# Patient Record
Sex: Female | Born: 1990 | Race: Black or African American | Hispanic: No | Marital: Single
Health system: Midwestern US, Community
[De-identification: ages and names within clinical notes are randomized; demographics above are authoritative.]

## PROBLEM LIST (undated history)

## (undated) DIAGNOSIS — O99012 Anemia complicating pregnancy, second trimester: Secondary | ICD-10-CM

## (undated) DIAGNOSIS — O98812 Other maternal infectious and parasitic diseases complicating pregnancy, second trimester: Secondary | ICD-10-CM

## (undated) DIAGNOSIS — A749 Chlamydial infection, unspecified: Secondary | ICD-10-CM

## (undated) DIAGNOSIS — O99013 Anemia complicating pregnancy, third trimester: Secondary | ICD-10-CM

## (undated) DIAGNOSIS — Z3402 Encounter for supervision of normal first pregnancy, second trimester: Secondary | ICD-10-CM

---

## 2016-02-13 ENCOUNTER — Emergency Department (HOSPITAL_COMMUNITY)
Admission: EM | Admit: 2016-02-13 | Discharge: 2016-02-13 | Disposition: A | Discharge status: Home / Self Care | Payer: Self-pay | Attending: Emergency Medicine | Admitting: Emergency Medicine

## 2016-02-13 ENCOUNTER — Encounter (HOSPITAL_COMMUNITY): Payer: Self-pay | Admitting: Emergency Medicine

## 2016-02-13 DIAGNOSIS — R531 Weakness: Secondary | ICD-10-CM | POA: Insufficient documentation

## 2016-02-13 DIAGNOSIS — R42 Dizziness and giddiness: Secondary | ICD-10-CM | POA: Insufficient documentation

## 2016-02-13 DIAGNOSIS — Z3202 Encounter for pregnancy test, result negative: Secondary | ICD-10-CM | POA: Insufficient documentation

## 2016-02-13 DIAGNOSIS — R200 Anesthesia of skin: Secondary | ICD-10-CM | POA: Insufficient documentation

## 2016-02-13 DIAGNOSIS — R109 Unspecified abdominal pain: Secondary | ICD-10-CM | POA: Insufficient documentation

## 2016-02-13 DIAGNOSIS — R197 Diarrhea, unspecified: Secondary | ICD-10-CM | POA: Insufficient documentation

## 2016-02-13 LAB — COMPREHENSIVE METABOLIC PANEL
ALT: 13 U/L — ABNORMAL LOW (ref 14–54)
ANION GAP: 12 (ref 5–15)
AST: 14 U/L — ABNORMAL LOW (ref 15–41)
Albumin: 4.1 g/dL (ref 3.5–5.0)
Alkaline Phosphatase: 46 U/L (ref 38–126)
BUN: 13 mg/dL (ref 6–20)
CALCIUM: 9.3 mg/dL (ref 8.9–10.3)
CHLORIDE: 104 mmol/L (ref 101–111)
CO2: 23 mmol/L (ref 22–32)
Creatinine, Ser: 0.83 mg/dL (ref 0.44–1.00)
GFR calc non Af Amer: >60 mL/min (ref >60)
GLUCOSE: 92 mg/dL (ref 65–99)
Potassium: 4 mmol/L (ref 3.5–5.1)
Sodium: 139 mmol/L (ref 135–145)
Total Bilirubin: 0.3 mg/dL (ref 0.3–1.2)
Total Protein: 7.4 g/dL (ref 6.5–8.1)

## 2016-02-13 LAB — CBC WITH DIFFERENTIAL/PLATELET
Basophils Absolute: 0 10*3/uL (ref 0.0–0.1)
Basophils Relative: 0 %
EOS ABS: 0.1 10*3/uL (ref 0.0–0.7)
EOS PCT: 1 %
HCT: 35.2 % — ABNORMAL LOW (ref 36.0–46.0)
Hemoglobin: 11.1 g/dL — ABNORMAL LOW (ref 12.0–15.0)
LYMPHS ABS: 1.5 10*3/uL (ref 0.7–4.0)
LYMPHS PCT: 20 %
MCH: 22.4 pg — AB (ref 26.0–34.0)
MCHC: 31.5 g/dL (ref 30.0–36.0)
MCV: 71 fL — AB (ref 78.0–100.0)
MONO ABS: 0.7 10*3/uL (ref 0.1–1.0)
Monocytes Relative: 9 %
Neutro Abs: 5.4 10*3/uL (ref 1.7–7.7)
Neutrophils Relative %: 70 %
PLATELETS: 310 10*3/uL (ref 150–400)
RBC: 4.96 MIL/uL (ref 3.87–5.11)
RDW: 13.9 % (ref 11.5–15.5)
WBC: 7.7 10*3/uL (ref 4.0–10.5)

## 2016-02-13 LAB — POC URINE PREG, ED: PREG TEST UR: NEGATIVE

## 2016-02-13 LAB — URINALYSIS, ROUTINE W REFLEX MICROSCOPIC
Bilirubin Urine: NEGATIVE
Glucose, UA: NEGATIVE mg/dL
Hgb urine dipstick: NEGATIVE
Ketones, ur: 15 mg/dL — AB
LEUKOCYTES UA: NEGATIVE
NITRITE: NEGATIVE
PH: 6 (ref 5.0–8.0)
Protein, ur: NEGATIVE mg/dL
SPECIFIC GRAVITY, URINE: 1.029 (ref 1.005–1.030)

## 2016-02-13 LAB — LIPASE, BLOOD: Lipase: 30 U/L (ref 11–51)

## 2016-02-13 MED ORDER — LOPERAMIDE HCL 2 MG PO CAPS
2.0000 mg | ORAL_CAPSULE | Freq: Once | ORAL | Status: AC
  Administered 2016-02-13: 2 mg via ORAL
  Filled 2016-02-13 (×2): qty 1

## 2016-02-13 MED ORDER — LOPERAMIDE HCL 2 MG PO CAPS: 2.0000 mg | ORAL_CAPSULE | ORAL | Status: DC | PRN

## 2016-02-13 NOTE — ED Notes (Addendum)
Pt. reports intermittent diarrhea for 2 weeks ( today x2) with nausea , upper abdominal pain  and mild SOB , denies emesis or fever .

## 2016-02-13 NOTE — Discharge Instructions (Signed)

## 2016-02-13 NOTE — ED Provider Notes (Signed)
CSN: [id number]     Arrival date & time 02/13/16  8657 History  By signing my name below, I, Barbara Conley, attest that this documentation has been prepared under the direction and in the presence of Shon Baton, MD. Electronically Signed: Phillis Conley, ED Scribe. 02/13/2016. 1:06 AM. . Chief Complaint  Patient presents with  . Diarrhea   The history is provided by the patient. No language interpreter was used.    HPI Comments: Barbara Conley is a 25 y.o. female who presents to the Emergency Department complaining of intermittent lightheadedness, room spinning and off balance dizziness, and diarrhea onset 2 weeks ago, worsening tonight. She reports associated cramping abdominal pain, bilateral lower extremity numbness and generalized weakness. She states that her significant other was sick recently with similar symptoms. She reports worsening pain with standing. She has not seen her PCP for this problem or taken anything at home for her symptoms. She denies fever, chills, chest pain, nausea, vomiting, or dysuria. She denies daily use of medication or allergies to medication.   History reviewed. No pertinent past medical history. History reviewed. No pertinent past surgical history. No family history on file. Social History  Substance Use Topics  . Smoking status: Never Smoker   . Smokeless tobacco: None  . Alcohol Use: No   OB History    No data available     Review of Systems  Constitutional: Negative for fever and chills.  Gastrointestinal: Positive for abdominal pain and diarrhea. Negative for nausea and vomiting.  Genitourinary: Negative for dysuria.  Neurological: Positive for dizziness and light-headedness.  All other systems reviewed and are negative.     Allergies  Review of patient's allergies indicates no known allergies.  Home Medications   Prior to Admission medications   Medication Sig Start Date End Date Taking? Authorizing Provider  loperamide  (IMODIUM) 2 MG capsule Take 1 capsule (2 mg total) by mouth as needed for diarrhea or loose stools. 02/13/16   Shon Baton, MD   BP 121/80 mmHg  Pulse 72  Temp(Src) 98.2 F (36.8 C) (Oral)  Resp 18  Ht  (1.753 m)  Wt 143 lb 1 oz (64.893 kg)  BMI 21.12 kg/m2  SpO2 100%  LMP 01/25/2016 (Approximate) Physical Exam  Constitutional: She is oriented to person, place, and time. She appears well-developed and well-nourished. No distress.  HENT:  Head: Normocephalic and atraumatic.  Cardiovascular: Normal rate, regular rhythm and normal heart sounds.   No murmur heard. Pulmonary/Chest: Effort normal and breath sounds normal. No respiratory distress. She has no wheezes.  Abdominal: Soft. Bowel sounds are normal. There is no tenderness. There is no rebound.  Neurological: She is alert and oriented to person, place, and time.  Cranial nerves II through XII intact, no dysmetria to finger-nose-finger, normal gait  Skin: Skin is warm and dry.  Psychiatric: She has a normal mood and affect.  Nursing note and vitals reviewed.   ED Course  Procedures (including critical care time) DIAGNOSTIC STUDIES: Oxygen Saturation is 100% on RA, normal by my interpretation.    COORDINATION OF CARE: 1:06 AM-Discussed treatment plan which includes labs with pt at bedside and pt agreed to plan.    Labs Review Labs Reviewed  CBC WITH DIFFERENTIAL/PLATELET - Abnormal; Notable for the following:    Hemoglobin 11.1 (*)    HCT 35.2 (*)    MCV 71.0 (*)    MCH 22.4 (*)    All other components within normal limits  COMPREHENSIVE  METABOLIC PANEL - Abnormal; Notable for the following:    AST 14 (*)    ALT 13 (*)    All other components within normal limits  URINALYSIS, ROUTINE W REFLEX MICROSCOPIC (NOT AT [facility]) - Abnormal; Notable for the following:    Ketones, ur 15 (*)    All other components within normal limits  LIPASE, BLOOD  POC URINE PREG, ED    Imaging Review No results found. I have  personally reviewed and evaluated these images and lab results as part of my medical decision-making.   EKG Interpretation None      MDM   Final diagnoses:  Diarrhea, unspecified type    Patient presents with two-week history of diarrhea. Also reports lightheadedness. Nontoxic and nonfocal. Vital signs are reassuring. No reproducible abdominal tenderness on exam. She is afebrile. She's not tried anything at home. Patient was given one dose of Imodium. She is not orthostatic. Basic labwork is reassuring.  Patient may have mild dehydration with 15 ketones in the urine. She is able to orally hydrate.  Doubt acute emergent process. Patient may need follow-up with primary physician or GI if diarrhea persists. She has no risk factors for infectious diarrhea or C. difficile. However, if diarrhea but she says she may need further studies including stool studies. Discussed with patient who is agreeable with plan.  After history, exam, and medical workup I feel the patient has been appropriately medically screened and is safe for discharge home. Pertinent diagnoses were discussed with the patient. Patient was given return precautions.  I personally performed the services described in this documentation, which was scribed in my presence. The recorded information has been reviewed and is accurate.    Shon Baton, MD 02/13/16 [phone]

## 2017-03-23 ENCOUNTER — Emergency Department (HOSPITAL_COMMUNITY): Payer: Self-pay

## 2017-03-23 ENCOUNTER — Emergency Department (HOSPITAL_COMMUNITY)
Admission: EM | Admit: 2017-03-23 | Discharge: 2017-03-23 | Disposition: A | Discharge status: Home / Self Care | Payer: Self-pay | Attending: Emergency Medicine | Admitting: Emergency Medicine

## 2017-03-23 ENCOUNTER — Encounter (HOSPITAL_COMMUNITY): Payer: Self-pay

## 2017-03-23 DIAGNOSIS — R0789 Other chest pain: Secondary | ICD-10-CM | POA: Insufficient documentation

## 2017-03-23 DIAGNOSIS — R10816 Epigastric abdominal tenderness: Secondary | ICD-10-CM | POA: Insufficient documentation

## 2017-03-23 LAB — BASIC METABOLIC PANEL
Anion gap: 8 (ref 5–15)
BUN: 11 mg/dL (ref 6–20)
CO2: 28 mmol/L (ref 22–32)
CREATININE: 0.74 mg/dL (ref 0.44–1.00)
Calcium: 9.8 mg/dL (ref 8.9–10.3)
Chloride: 103 mmol/L (ref 101–111)
Glucose, Bld: 86 mg/dL (ref 65–99)
Potassium: 3.8 mmol/L (ref 3.5–5.1)
SODIUM: 139 mmol/L (ref 135–145)

## 2017-03-23 LAB — CBC
HCT: 34.4 % — ABNORMAL LOW (ref 36.0–46.0)
Hemoglobin: 11.1 g/dL — ABNORMAL LOW (ref 12.0–15.0)
MCH: 23 pg — ABNORMAL LOW (ref 26.0–34.0)
MCHC: 32.3 g/dL (ref 30.0–36.0)
MCV: 71.2 fL — ABNORMAL LOW (ref 78.0–100.0)
PLATELETS: 327 10*3/uL (ref 150–400)
RBC: 4.83 MIL/uL (ref 3.87–5.11)
RDW: 14.3 % (ref 11.5–15.5)
WBC: 6.3 10*3/uL (ref 4.0–10.5)

## 2017-03-23 LAB — I-STAT TROPONIN, ED: Troponin i, poc: 0 ng/mL (ref 0.00–0.08)

## 2017-03-23 NOTE — ED Triage Notes (Addendum)
Per Pt, Pt is coming from home with complaints of intermittent throbbing chest pain x 1 month. Reports some SOB, Light headedness, Dizziness. Denies any nausea or vomiting. Has no Hx of the same. Reports cough and congestion with green sputum.

## 2017-03-23 NOTE — ED Provider Notes (Signed)
[facility]-EMERGENCY DEPT Provider Note   CSN: [id number] Arrival date & time: 03/23/17  1433     History   Chief Complaint Chief Complaint  Patient presents with  . Chest Pain    HPI Barbara Conley Barbara Conley is a 26 y.o. female.  HPI   This is a 26 year old female who presents today complaining of intermittent chest pain over the past month. States it comes up to 3 times a day and is present for seconds to minutes. She describes it as in the lower sternum and points to bilateral flank area as radiation. She has no associated symptoms. She denies any cough, fever, leg swelling, history of DVT or PE. She is not able to describe any causative factors or relieving factors.  History reviewed. No pertinent past medical history.  There are no active problems to display for this patient.   History reviewed. No pertinent surgical history.  OB History    No data available       Home Medications    Prior to Admission medications   Not on File    Family History No family history on file.  Social History Social History  Substance Use Topics  . Smoking status: Never Smoker  . Smokeless tobacco: Never Used  . Alcohol use No     Allergies   Patient has no known allergies.   Review of Systems Review of Systems  All other systems reviewed and are negative.    Physical Exam Updated Vital Signs BP 134/87   Pulse 66   Temp 98.1 F (36.7 C) (Oral)   Resp 14   Ht 5\' 9"  (1.753 m)   Wt 65.8 kg   LMP 02/22/2017   SpO2 100%   BMI 21.41 kg/m   Physical Exam  Constitutional: She is oriented to person, place, and time. She appears well-developed and well-nourished. No distress.  HENT:  Head: Normocephalic and atraumatic.  Right Ear: External ear normal.  Left Ear: External ear normal.  Nose: Nose normal.  Eyes: Conjunctivae and EOM are normal. Pupils are equal, round, and reactive to light.  Neck: Normal range of motion. Neck supple.  Cardiovascular: Normal rate, regular  rhythm, normal heart sounds and intact distal pulses.   Pulmonary/Chest: Effort normal and breath sounds normal. She exhibits tenderness.  Mild right peristernal chest tenderness  Abdominal: Soft. Bowel sounds are normal.  Mild epigastric tenderness to palpation  Musculoskeletal: Normal range of motion.  Neurological: She is alert and oriented to person, place, and time. She exhibits normal muscle tone. Coordination normal.  Skin: Skin is warm and dry.  Psychiatric: She has a normal mood and affect. Her behavior is normal. Thought content normal.  Nursing note and vitals reviewed.    ED Treatments / Results  Labs (all labs ordered are listed, but only abnormal results are displayed) Labs Reviewed  CBC - Abnormal; Notable for the following:       Result Value   Hemoglobin 11.1 (*)    HCT 34.4 (*)    MCV 71.2 (*)    MCH 23.0 (*)    All other components within normal limits  BASIC METABOLIC PANEL  I-STAT TROPOININ, ED    EKG  EKG Interpretation  Date/Time:  Thursday March 23 2017 14:38:45 EDT Ventricular Rate:  69 PR Interval:  130 QRS Duration: 86 QT Interval:  382 QTC Calculation: 409 R Axis:   89 Text Interpretation:  Normal sinus rhythm with sinus arrhythmia Early repolarization Normal ECG Confirmed by Simmone Cape MD, Akera Snowberger (  [id number]) on 03/23/2017 8:58:27 PM       Radiology Dg Chest 2 View  Result Date: 03/23/2017 CLINICAL DATA:  Central chest pain, shortness of breath, and productive cough for 1 week. EXAM: CHEST  2 VIEW COMPARISON:  None. FINDINGS: The cardiomediastinal silhouette is within normal limits. The lungs are well inflated and clear. There is no evidence of pleural effusion or pneumothorax. No acute osseous abnormality is identified. IMPRESSION: No active cardiopulmonary disease. Electronically Signed   By: Sebastian Ache M.D.   On: 03/23/2017 15:08    Procedures Procedures (including critical care time)  Medications Ordered in ED Medications - No data to  display   Initial Impression / Assessment and Plan / ED Course  I have reviewed the triage vital signs and the nursing notes.  Pertinent labs & imaging results that were available during my care of the patient were reviewed by me and considered in my medical decision making (see chart for details).  Healthy 26 year old female with atypical chest pain. Low index of suspicion for acute coronary syndrome. His EKG is normal and troponin is normal. No physical exam signs of DVT. Per negative. No evidence of infection and chest x-Barbara Conley clear. I discussed return precautions and need for follow-up and patient voices understanding.c Final Clinical Impressions(s) / ED Diagnoses   Final diagnoses:  Atypical chest pain    New Prescriptions New Prescriptions   No medications on file     Margarita Grizzle, MD 03/24/17 [phone]

## 2017-03-23 NOTE — ED Notes (Signed)
Signature pad not working. 

## 2017-05-23 ENCOUNTER — Ambulatory Visit
Admit: 2017-05-23 | Discharge: 2017-05-23 | Payer: PRIVATE HEALTH INSURANCE | Attending: Family Medicine | Primary: Family Medicine

## 2017-05-23 DIAGNOSIS — Z021 Encounter for pre-employment examination: Secondary | ICD-10-CM

## 2017-05-23 NOTE — Progress Notes (Signed)
Subjective:  Jessica Davies is a 26 y.o. female presents today with medical problems and to obtain refills if necessary, and they are also meeting me as their new physician for the first time as their initial visit today for an age appropriate pre-employment form completion and exam    See chart for updated SH, FH, PSH, PMH, CURRENT MEDICATIONS AND HABITS, and complete systems review.    Objective:  Blood pressure 112/70, pulse 66, height 5' 8.5" (1.74 m), weight 148 lb (67.1 kg).  Body mass index is 22.18 kg/(m^2).     General- pleasant, no distress  Psych- alert and oriented to person, place and time  Mood and affect are appropriate to the visit  Lymph/Neck: normal  HEENT: normal  LUNGS: bcta  HEART: rrr s mrg  ABD: wnl  BONE/JOINT: normal  NEURO: normal  SKIN: normal    Assessment:  1. Pre-employment examination      Plan:   No orders of the defined types were placed in this encounter.    Follow-up Disposition:  Return if symptoms worsen or fail to improve.

## 2017-07-04 ENCOUNTER — Inpatient Hospital Stay
Admit: 2017-07-04 | Discharge: 2017-07-05 | Disposition: A | Discharge status: Home / Self Care | Payer: Self-pay | Attending: Emergency Medicine

## 2017-07-04 ENCOUNTER — Emergency Department: Admit: 2017-07-04 | Payer: Self-pay | Primary: Family Medicine

## 2017-07-04 DIAGNOSIS — O26891 Other specified pregnancy related conditions, first trimester: Secondary | ICD-10-CM

## 2017-07-04 LAB — BETA HCG, QT
Beta HCG, QT: 8460 m[IU]/mL — ABNORMAL HIGH (ref 0.0–6.0)
hCG Quant: 8460 m[IU]/mL — ABNORMAL HIGH (ref 0.0–6.0)

## 2017-07-04 LAB — METABOLIC PANEL, COMPREHENSIVE
A-G Ratio: 1 — ABNORMAL LOW (ref 1.2–3.5)
ALT (SGPT): 22 U/L (ref 12–65)
AST (SGOT): 32 U/L (ref 15–37)
Albumin: 3.6 g/dL (ref 3.5–5.0)
Alk. phosphatase: 46 U/L — ABNORMAL LOW (ref 50–136)
Anion gap: 4 mmol/L — ABNORMAL LOW (ref 7–16)
BUN: 12 MG/DL (ref 6–23)
Bilirubin, total: 0.2 MG/DL (ref 0.2–1.1)
CO2: 26 mmol/L (ref 21–32)
Calcium: 9.4 MG/DL (ref 8.3–10.4)
Chloride: 102 mmol/L (ref 98–107)
Creatinine: 0.64 MG/DL (ref 0.6–1.0)
GFR est AA: >60 mL/min/{1.73_m2} (ref >60)
GFR est non-AA: >60 mL/min/{1.73_m2} (ref >60)
Globulin: 3.7 g/dL — ABNORMAL HIGH (ref 2.3–3.5)
Glucose: 76 mg/dL (ref 65–100)
Potassium: 4.2 mmol/L (ref 3.5–5.1)
Protein, total: 7.3 g/dL (ref 6.3–8.2)
Sodium: 132 mmol/L — ABNORMAL LOW (ref 136–145)

## 2017-07-04 LAB — CBC WITH AUTOMATED DIFF
ABS. BASOPHILS: 0 10*3/uL (ref 0.0–0.2)
ABS. EOSINOPHILS: 0.1 10*3/uL (ref 0.0–0.8)
ABS. IMM. GRANS.: 0 10*3/uL (ref 0.0–0.5)
ABS. LYMPHOCYTES: 1.8 10*3/uL (ref 0.5–4.6)
ABS. MONOCYTES: 0.4 10*3/uL (ref 0.1–1.3)
ABS. NEUTROPHILS: 3.9 10*3/uL (ref 1.7–8.2)
BASOPHILS: 0 % (ref 0.0–2.0)
EOSINOPHILS: 1 % (ref 0.5–7.8)
HCT: 29.1 % — ABNORMAL LOW (ref 35.8–46.3)
HGB: 9.5 g/dL — ABNORMAL LOW (ref 11.7–15.4)
IMMATURE GRANULOCYTES: 1 % (ref 0.0–5.0)
LYMPHOCYTES: 29 % (ref 13–44)
MCH: 22.7 PG — ABNORMAL LOW (ref 26.1–32.9)
MCHC: 32.6 g/dL (ref 31.4–35.0)
MCV: 69.6 FL — ABNORMAL LOW (ref 79.6–97.8)
MONOCYTES: 7 % (ref 4.0–12.0)
MPV: 10 FL — ABNORMAL LOW (ref 10.8–14.1)
NEUTROPHILS: 62 % (ref 43–78)
PLATELET: 318 10*3/uL (ref 150–450)
RBC: 4.18 M/uL (ref 4.05–5.25)
RDW: 14.4 % (ref 11.9–14.6)
WBC: 6.2 10*3/uL (ref 4.3–11.1)

## 2017-07-04 NOTE — ED Provider Notes (Signed)
HPI Comments: 26 year old lady resents with concerns about pain in her left pelvic region, going into her left back and her left side.  Patient is G1 at approximately 4 weeks 2 days.  She denies any vaginal bleeding or discharge.  She has had no blood in her urine or bowels and pain with urination or bowel movements.  She said she has had no abdominal surgeries and no history of ovarian cysts.    Elements of this note were created using speech recognition software.  As such, errors of speech recognition may be present.    Patient is a 26 y.o. female presenting with arm pain and flank pain. The history is provided by the patient.   Arm Pain      Flank Pain    Associated symptoms include pelvic pain. Pertinent negatives include no fever.        History reviewed. No pertinent past medical history.    History reviewed. No pertinent surgical history.      Family History:   Problem Relation Age of Onset   ??? No Known Problems Mother    ??? No Known Problems Father    ??? Canavan disease Maternal Grandfather    ??? Cancer Maternal Grandfather        Social History     Social History   ??? Marital status: SINGLE     Spouse name: N/A   ??? Number of children: N/A   ??? Years of education: N/A     Occupational History   ??? Not on file.     Social History Main Topics   ??? Smoking status: Never Smoker   ??? Smokeless tobacco: Never Used   ??? Alcohol use Yes   ??? Drug use: Not on file   ??? Sexual activity: Not on file     Other Topics Concern   ??? Not on file     Social History Narrative         ALLERGIES: Review of patient's allergies indicates no known allergies.    Review of Systems   Constitutional: Negative for chills and fever.   HENT: Negative for congestion and rhinorrhea.    Respiratory: Negative for cough and shortness of breath.    Gastrointestinal: Negative for diarrhea, nausea and vomiting.   Genitourinary: Positive for flank pain and pelvic pain. Negative for hematuria, vaginal bleeding and vaginal pain.       Vitals:     07/04/17 1825   BP: 134/58   Pulse: 70   Resp: 18   Temp: 98.2 ??F (36.8 ??C)   SpO2: 100%   Weight: 67.1 kg (148 lb)   Height: 5\' 8"  (1.727 m)            Physical Exam   Constitutional: She is oriented to person, place, and time. She appears well-developed and well-nourished.   Neck: No JVD present. No tracheal deviation present.   Pulmonary/Chest: Effort normal and breath sounds normal.   Abdominal: Soft. Bowel sounds are normal.   Neurological: She is alert and oriented to person, place, and time.   Skin: Skin is warm and dry.   Nursing note and vitals reviewed.       MDM  Number of Diagnoses or Management Options  Diagnosis management comments: Patient's pregnancy test was positive.  I will get an ultrasound to rule out ectopic as well as check her basic blood work.        ED Course       Procedures

## 2017-07-04 NOTE — ED Triage Notes (Signed)
Patient states she is [redacted] weeks pregnant. Left sided arm, leg and side pain.

## 2017-07-04 NOTE — ED Notes (Signed)

## 2017-07-11 ENCOUNTER — Institutional Professional Consult (permissible substitution): Admit: 2017-07-11 | Discharge: 2017-07-11 | Primary: Family Medicine

## 2017-07-11 DIAGNOSIS — Z111 Encounter for screening for respiratory tuberculosis: Secondary | ICD-10-CM

## 2017-07-13 ENCOUNTER — Institutional Professional Consult (permissible substitution): Admit: 2017-07-13 | Discharge: 2017-07-13 | Payer: PRIVATE HEALTH INSURANCE | Primary: Family Medicine

## 2017-07-13 DIAGNOSIS — Z111 Encounter for screening for respiratory tuberculosis: Secondary | ICD-10-CM

## 2017-07-13 LAB — AMB POC TUBERCULOSIS, INTRADERMAL (SKIN TEST)
PPD: NEGATIVE Negative
mm Induration: 0 mm

## 2017-08-08 ENCOUNTER — Encounter: Primary: Family Medicine

## 2017-08-23 ENCOUNTER — Ambulatory Visit: Primary: Family Medicine

## 2017-08-23 ENCOUNTER — Encounter: Admit: 2017-08-23 | Discharge: 2017-08-23 | Payer: MEDICAID | Primary: Family Medicine

## 2017-08-23 ENCOUNTER — Ambulatory Visit: Admit: 2017-08-23 | Discharge: 2017-08-23 | Payer: MEDICAID | Primary: Family Medicine

## 2017-08-23 DIAGNOSIS — Z3A13 13 weeks gestation of pregnancy: Secondary | ICD-10-CM

## 2017-08-23 LAB — TYPE, ABO & RH, EXTERNAL
ABO,Rh: B POS
TYPE, ABO & RH, EXTERNAL: B POS

## 2017-08-23 LAB — HIV-1 AB, EXTERNAL

## 2017-08-23 LAB — URINALYSIS W/O MICRO, EXTERNAL: Urine, External: NEGATIVE

## 2017-08-23 LAB — AMB POC URINE PREGNANCY TEST, VISUAL COLOR COMPARISON: HCG urine, Ql. (POC): POSITIVE

## 2017-08-23 LAB — HEMATOCRIT, EXTERNAL: Hct, External: 31.9

## 2017-08-23 LAB — PLATELET, EXTERNAL: Platelet cnt., External: 321

## 2017-08-23 LAB — RPR, EXTERNAL

## 2017-08-23 LAB — RUBELLA AB, IGG, EXTERNAL: Rubella, External: IMMUNE

## 2017-08-23 LAB — HEMOGLOBIN FRACTIONATION, EXTERNAL: Hemoglobin eval., External: NEGATIVE

## 2017-08-23 LAB — ANTIBODY SCREEN, EXTERNAL: Antibody screen, External: NEGATIVE

## 2017-08-23 LAB — HEMOGLOBIN, EXTERNAL: Hgb, External: 10.1

## 2017-08-23 LAB — HEPATITIS B SURFACE AG, EXTERNAL: HBsAg, External: NEGATIVE

## 2017-08-23 NOTE — Progress Notes (Signed)
EOB US 08/23/17

## 2017-08-23 NOTE — Progress Notes (Signed)
Patient seen for NOB talk + EOB US. [facility] by ultrasound dating, due to irregular cycles. All new OB education discussed. Patient is undecided on NIPT. Past medical history is insignificant - the history is provided by the patient. All questions answered. Patient leaves office in good spirits today.

## 2017-08-25 LAB — HIV 1/2 ANTIGEN/ANTIBODY, FOURTH GENERATION W/RFL: HIV Screen 4th Generation wRfx: NONREACTIVE

## 2017-08-25 LAB — RPR
RPR: NONREACTIVE
RPR: NONREACTIVE

## 2017-08-25 LAB — CULTURE, URINE

## 2017-08-25 LAB — BLOOD TYPE, (ABO+RH)
Rh (D): POSITIVE
Rh Type: POSITIVE

## 2017-08-25 LAB — CBC WITH AUTOMATED DIFF
ABS. BASOPHILS: 0 10*3/uL (ref 0.0–0.2)
ABS. EOSINOPHILS: 0 10*3/uL (ref 0.0–0.4)
ABS. IMM. GRANS.: 0 10*3/uL (ref 0.0–0.1)
ABS. MONOCYTES: 0.4 10*3/uL (ref 0.1–0.9)
ABS. NEUTROPHILS: 4.6 10*3/uL (ref 1.4–7.0)
Abs Lymphocytes: 0.9 10*3/uL (ref 0.7–3.1)
BASOPHILS: 0 %
EOSINOPHILS: 1 %
HCT: 31.9 % — ABNORMAL LOW (ref 34.0–46.6)
HGB: 10.1 g/dL — ABNORMAL LOW (ref 11.1–15.9)
IMMATURE GRANULOCYTES: 0 %
Lymphocytes: 16 %
MCH: 22.6 pg — ABNORMAL LOW (ref 26.6–33.0)
MCHC: 31.7 g/dL (ref 31.5–35.7)
MCV: 71 fL — ABNORMAL LOW (ref 79–97)
MONOCYTES: 7 %
NEUTROPHILS: 76 %
PLATELET: 321 10*3/uL (ref 150–379)
RBC: 4.47 x10E6/uL (ref 3.77–5.28)
RDW: 16.6 % — ABNORMAL HIGH (ref 12.3–15.4)
WBC: 6 10*3/uL (ref 3.4–10.8)

## 2017-08-25 LAB — COMMENT

## 2017-08-25 LAB — RUBELLA AB, IGG: Rubella Ab, IgG: 12.1 index (ref >0.99)

## 2017-08-25 LAB — HGB SOLUBILITY W/REFL FRAC: Hgb Solubility: NEGATIVE

## 2017-08-25 LAB — HIV 1/2 AG/AB, 4TH GENERATION,W RFLX CONFIRM: HIV SCREEN 4TH GENERATION WRFX: NONREACTIVE

## 2017-08-25 LAB — HCV AB W/REFLEX VERIFICATION: HCV Ab: <0.1 s/co ratio (ref 0.0–0.9)

## 2017-08-25 LAB — ANTIBODY SCREEN: Antibody screen: NEGATIVE

## 2017-08-25 LAB — HEP B SURFACE AG: Hep B surface Ag screen: NEGATIVE

## 2017-08-29 ENCOUNTER — Encounter: Payer: MEDICAID | Attending: Obstetrics & Gynecology | Primary: Family Medicine

## 2017-08-29 NOTE — Progress Notes (Signed)
OB labs: hgb low, start iron supplementation

## 2017-08-29 NOTE — Telephone Encounter (Signed)
Called patient and LM advising to start daily iron supplement. Can get OTC-slow release iron.  Call back with questions.

## 2017-08-30 ENCOUNTER — Encounter: Primary: Family Medicine

## 2017-08-30 ENCOUNTER — Other Ambulatory Visit
Admit: 2017-08-30 | Discharge: 2017-08-30 | Payer: MEDICAID | Attending: Obstetrics & Gynecology | Primary: Family Medicine

## 2017-08-30 DIAGNOSIS — Z3402 Encounter for supervision of normal first pregnancy, second trimester: Secondary | ICD-10-CM

## 2017-08-30 LAB — N GONORRHOEAE, DNA PROBE, EXTERNAL: Gonorrhea, External: NEGATIVE

## 2017-08-30 LAB — CHLAMYDIA DNA PROBE, EXTERNAL: Chlamydia, External: POSITIVE

## 2017-08-30 LAB — PAP SMEAR, EXTERNAL: Pap smear, External: NEGATIVE

## 2017-08-30 LAB — AMB POC OB URINE DIP: Glucose (UA POC): NEGATIVE

## 2017-08-30 NOTE — Progress Notes (Signed)
Ms. Jessica DubinMarshall G1 [id number]  presents today for her initial OB exam.  She feels well.      Patient's last menstrual period was 06/03/2017.  Estimated Date of Delivery: 02/27/18  Last Pap Smear: over 2 years ago  OB History:   Obstetric History    G1   P0   T0   P0   A0   L0     SAB0   TAB0   Ectopic0   Molar0   Multiple0   Live Births0       # Outcome Date GA Lbr Len/2nd Weight Sex Delivery Anes PTL Lv   1 Current                   Past Gyn History:   GYN History       regular periods.  No abnl pap or STIs.         Allergies:   No Known Allergies    Medication History:  Current Outpatient Prescriptions   Medication Sig Dispense Refill   ??? prenatal24-iron chel-folic-dha (PRENATAL DHA+COMPLETE PRENATAL) 30-975-300 mg-mcg-mg cmpk Take  by mouth.         Past Medical History:  Past Medical History:   Diagnosis Date   ??? Anemia        Past Surgical History:  No past surgical history on file.    Social History:  Social History     Social History   ??? Marital status: SINGLE - together 4 years      Spouse name: N/A   ??? Number of children: N/A   ??? Years of education: N/A     Occupational History   ??? 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 Smoking status: Never Smoker   ??? Smokeless tobacco: Never Used   ??? Alcohol use Yes   ??? Drug use: No   ??? Sexual activity: Yes     Partners: Male     Other Topics Concern   ??? Exercise  - not really      Social History Narrative       Family History:  Family History   Problem Relation Age of Onset   ??? MS Mother    ??? Cancer Father - prostate    ??? Cancer Maternal Grandfather            ROS    A comprehensive review of systems was negative except for that written in the history of present illness.     Visit Vitals   ??? BP 110/60   ??? Wt 146 lb (66.2 kg)   ??? LMP 06/03/2017   ??? BMI 21.56 kg/m2     Glucose (UA POC)   Date Value Ref Range Status   08/30/2017 Negative Negative Final     Protein (UA POC)   Date Value Ref Range Status   08/30/2017 Trace Negative Final      general:  Well nourished well developed female in nad   Heart rrr  Lungs cta b&s  Abdomen soft nontender.  No enlargement of liver.    Extremities: no calf pain  Pelvic exam: normal external genitalia, vulva, vagina, cervix, uterus (14 week size)  and adnexa.  Breasts: breasts appear normal, no suspicious masses, no skin or nipple changes or axillary nodes.      Assessment and Plan:     Diagnoses and all orders for this visit:    1. Encounter  for supervision of normal first pregnancy in second trimester  -     Urinalysis, Non-auto w/o Micro (81002)  -     IGP, CtNg, Reflex Aptima HPV ASCU ([id number])    2. [redacted] weeks gestation of pregnancy  -     Urinalysis, Non-auto w/o Micro (81002)  -     IGP, CtNg, Reflex Aptima HPV ASCU ([id number])    3. Screening for genitourinary condition  -     Urinalysis, Non-auto w/o Micro (81002)    4. Screening examination for venereal disease  -     IGP, CtNg, Reflex Aptima HPV ASCU ([id number])    5. Screening for viral and chlamydial diseases  -     IGP, CtNg, Reflex Aptima HPV ASCU ([id number])          Counseling was performed regarding expected weight gain, exercise, travel and limitation of travel to [place] affected areas, risks of PTL,  avoidance of cat feces and raw material.  Discussed genetic testing and pt declines.  All questions were answered.  Anemia - iron rich diet.  Check hg next visit.             RTC 4 weeks.

## 2017-08-30 NOTE — Progress Notes (Signed)
+  cramps  Last Pap Smear: 2 years ago  Undecided on genetic testing

## 2017-09-01 ENCOUNTER — Telehealth

## 2017-09-01 LAB — PAP IG, CT-NG, RFX APTIMA HPV ASCUS (199320)
CHLAMYDIA, NUC. ACID AMP, 186114: POSITIVE — AB
GONOCOCCUS, NUC. ACID AMP, 186122: NEGATIVE
LABCORP 019018: 0

## 2017-09-01 LAB — PAP IG, CT-NG, RFX APTIMA HPV ASCUS (183194, 507800)
.: 0
Chlamydia, Nuc. Acid Amp.: POSITIVE — AB
Gonococcus, Nuc. Acid Amp.: NEGATIVE

## 2017-09-01 NOTE — Telephone Encounter (Signed)
Called patient-no answer.  LM advising to call back when messaged received, or Monday if we have closed for the day.  Patient needs treatment for + Chlamydia.

## 2017-09-01 NOTE — Progress Notes (Signed)
Neg pap

## 2017-09-01 NOTE — Progress Notes (Signed)
Await pap but chlamydia positive unfortunately.  zith 1 gram po x 1

## 2017-09-04 ENCOUNTER — Encounter

## 2017-09-04 MED ORDER — AZITHROMYCIN 500 MG TAB
500 mg | ORAL_TABLET | ORAL | 0 refills | Status: AC
Start: 2017-09-04 — End: 2017-09-04

## 2017-09-04 NOTE — Telephone Encounter (Signed)
Patient returned call to office; spoke with patient. Informed patient +chlamydia on PAP. Needs treated; your partner needs to be tested/treaeted as well. Would refrain from any sexual activity until both partners treated. Patient v/u.    Orders Placed This Encounter   ??? azithromycin (ZITHROMAX) 500 mg tab     Sig: Take 2 Tabs by mouth now for 1 dose.     Dispense:  2 Tab     Refill:  0

## 2017-09-04 NOTE — Telephone Encounter (Signed)
Attempted to contact patient with no answer. LMOM for patient to return my call.

## 2017-09-22 NOTE — Telephone Encounter (Signed)
Operator: Christena Deem patient at [redacted]w[redacted]d called the after hours answering service with complaints of lower abdominal pressure x 1 week. Pressure/pain described as tightening. Denies VB, contractions, constipation, urinary symptoms, or fever. States she has increased water intake but has not taken tylenol. Patient advised to rest, increase water intake, can take tylenol and use as directed, and can try a heating pad on low setting on her back. She is to follow up at [facility] ER if sxs not improved, worsen, or at any time she feels she needs to be seen. All questions answered and she voiced full understanding.

## 2017-09-28 ENCOUNTER — Ambulatory Visit: Attending: Obstetrics & Gynecology | Primary: Family Medicine

## 2017-09-28 ENCOUNTER — Ambulatory Visit
Admit: 2017-09-28 | Discharge: 2017-09-28 | Payer: MEDICAID | Attending: Obstetrics & Gynecology | Primary: Family Medicine

## 2017-09-28 DIAGNOSIS — Z3402 Encounter for supervision of normal first pregnancy, second trimester: Secondary | ICD-10-CM

## 2017-09-28 LAB — AMB POC OB URINE DIP: Glucose (UA POC): NEGATIVE

## 2017-09-28 NOTE — Progress Notes (Signed)
Dx with Chlamydia on 09/04/17- TOC today  Patient went to the ER on 09/29 due to fainting and they Dx her with hypoglycemia. [facility] [place]  Patient want's a flu vaccine today

## 2017-09-28 NOTE — Progress Notes (Signed)
Did TOC from urine, sono 3 weeks

## 2017-10-01 LAB — CHLAMYDIA/GC PCR
Chlamydia trachomatis, NAA: NEGATIVE
Neisseria gonorrhoeae, NAA: NEGATIVE

## 2017-10-19 ENCOUNTER — Encounter: Payer: MEDICAID | Primary: Family Medicine

## 2017-10-19 ENCOUNTER — Encounter: Payer: MEDICAID | Attending: Obstetrics & Gynecology | Primary: Family Medicine

## 2017-10-20 ENCOUNTER — Ambulatory Visit: Attending: Obstetrics & Gynecology | Primary: Family Medicine

## 2017-10-20 ENCOUNTER — Encounter: Admit: 2017-10-20 | Discharge: 2017-10-20 | Payer: MEDICAID | Primary: Family Medicine

## 2017-10-20 ENCOUNTER — Ambulatory Visit
Admit: 2017-10-20 | Discharge: 2017-10-20 | Payer: MEDICAID | Attending: Obstetrics & Gynecology | Primary: Family Medicine

## 2017-10-20 ENCOUNTER — Encounter

## 2017-10-20 DIAGNOSIS — Z363 Encounter for antenatal screening for malformations: Secondary | ICD-10-CM

## 2017-10-20 LAB — AMB POC OB URINE DIP: Glucose (UA POC): NEGATIVE

## 2017-10-20 NOTE — Progress Notes (Signed)
+  cramps,+headache

## 2017-10-20 NOTE — Progress Notes (Signed)
ANATOMY SCREENING 10/20/17

## 2017-10-20 NOTE — Progress Notes (Signed)
Appropriate growth.  Anatomy appears to be grossly WNL.  Except thickened nuchal fold at 7.369mm-- will refer to MFM  Posterior normal placenta w 3V cord.  Gender: Female  See full report with [place]

## 2017-10-30 ENCOUNTER — Ambulatory Visit: Attending: Maternal & Fetal Medicine | Primary: Family Medicine

## 2017-10-30 ENCOUNTER — Encounter: Admit: 2017-10-30 | Discharge: 2017-10-30 | Payer: PRIVATE HEALTH INSURANCE | Primary: Family Medicine

## 2017-10-30 ENCOUNTER — Ambulatory Visit
Admit: 2017-10-30 | Discharge: 2017-10-30 | Payer: PRIVATE HEALTH INSURANCE | Attending: Maternal & Fetal Medicine | Primary: Family Medicine

## 2017-10-30 DIAGNOSIS — O3510X0 Maternal care for (suspected) chromosomal abnormality in fetus, unspecified, not applicable or unspecified: Secondary | ICD-10-CM

## 2017-10-30 DIAGNOSIS — IMO0002 Reserved for concepts with insufficient information to code with codable children: Secondary | ICD-10-CM

## 2017-10-30 NOTE — Progress Notes (Signed)
MFM Consultation     CC:   Chief Complaint   Patient presents with   ??? Ultrasound     anatomy echo   ??? High Risk OB     Thickened NF       HPI: 26 y.o. year old G1 P0000 at 7357w6d with due date of Estimated Date of Delivery: 02/27/18 who presents for ultrasound only appointment due to Thickened Nuchal Fold noted on ultrasound in primary OB's office (on 10/26 was 7.9 mm at 9178w3d).    No HAs, edema.  Denies preeclamptic symptoms.  Reports good fetal movement.  No bleeding, LOF, cramping, ctxs, or vaginal pressure.  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  Scheduled to see primary OB [facility] on 11/26.        I have reviewed and confirmed the past medical, surgical and obstetric history in the chart.    Medications: I have reviewed medication list in the chart    Current Outpatient Medications:   ???  prenatal24-iron chel-folic-dha (PRENATAL DHA+COMPLETE PRENATAL) 30-975-300 mg-mcg-mg cmpk, Take  by mouth., Disp: , Rfl:     Allergies: I have reviewed allergy section in the chart  No Known Allergies      Vitals:    10/30/17 0805   BP: 118/60   Weight: 151 lb (68.5 kg)   Height: 5\' 9"  (1.753 m)         Ultrasound: Please see formal ultrasound report under media tab/imaging tab.     Assessment/Plan  26 y.o. G1 P0000 at 3157w6d referred for ultrasound evaluation of the pregnancy.    Patient Active Problem List    Diagnosis   ??? Nuchal fold thickening determined by ultrasound     10/30/2017 at [facility]:  Normal Anatomy/Fetal Echo, nuchal fold upper limits of normal for this gestational age.  No other findings.  Pt offered NIPT; desires testing.  Panorama drawn today--->    ?? Will call patient with results, see back if Risk Increased.  ?? No follow up scheduled at MFM.     ??? Chlamydia infection affecting pregnancy     +Chlamydia on 08/30/17  Treated with Zithromax 1g sent 09/04/17.   TOC ___     ??? Anemia affecting pregnancy     08-29-17 10.1-iron rich diet.  mcv 71.     Repeat cbc at 18week visit.  Consider hematology referral for more extensive workup.       ??? High-risk pregnancy in second trimester     Undecided on NIPT:    28-wk GTT:     ??? Encounter for immunization     Flu Vaccine given on 09/28/17         **See ultrasound report in media tab for further details.    Follow-up Disposition:  Return for No patient follow up scheduled at MFM office.Tildon Husky.    Trent Gabler C Ridhaan Dreibelbis, MD  10/30/2017    Total time caring for the patient was 30 minutes.  Of this, over 50% was spent in counseling and/or care coordination with patient regarding the above described findings from ultrasound, consultation.

## 2017-11-07 NOTE — Telephone Encounter (Signed)
11/07/2017      Patient left 2 messages to return call regarding lab results.    Janice NorrieLinda Spillers, RN

## 2017-11-07 NOTE — Telephone Encounter (Signed)
11/07/2017     Patient returned call and given results of NIPT low risk and gender female.      Janice NorrieLinda Spillers. RN

## 2017-11-20 ENCOUNTER — Encounter: Attending: Obstetrics & Gynecology | Primary: Family Medicine

## 2017-11-24 ENCOUNTER — Ambulatory Visit
Admit: 2017-11-24 | Discharge: 2017-11-24 | Payer: PRIVATE HEALTH INSURANCE | Attending: Obstetrics & Gynecology | Primary: Family Medicine

## 2017-11-24 DIAGNOSIS — Z3402 Encounter for supervision of normal first pregnancy, second trimester: Secondary | ICD-10-CM

## 2017-11-24 LAB — AMB POC OB URINE DIP
Glucose (UA POC): NEGATIVE
Protein (UA POC): NEGATIVE

## 2017-11-24 NOTE — Progress Notes (Signed)
Repeat CBC per MFM

## 2017-11-24 NOTE — Progress Notes (Signed)
+  cramping, +braxton hicks, +headches

## 2017-11-24 NOTE — Progress Notes (Signed)
AOK.  Discussed Unisom and Afrin for sleeplessness and nose bleeds.  Patient reassured.  CBC today.

## 2017-11-25 LAB — CBC WITH AUTOMATED DIFF
ABS. BASOPHILS: 0 10*3/uL (ref 0.0–0.2)
ABS. EOSINOPHILS: 0.1 10*3/uL (ref 0.0–0.4)
ABS. IMM. GRANS.: 0.1 10*3/uL (ref 0.0–0.1)
ABS. MONOCYTES: 0.5 10*3/uL (ref 0.1–0.9)
ABS. NEUTROPHILS: 5 10*3/uL (ref 1.4–7.0)
Abs Lymphocytes: 1.2 10*3/uL (ref 0.7–3.1)
BASOPHILS: 0 %
EOSINOPHILS: 1 %
HCT: 27.8 % — ABNORMAL LOW (ref 34.0–46.6)
HGB: 9.2 g/dL — ABNORMAL LOW (ref 11.1–15.9)
IMMATURE GRANULOCYTES: 1 %
Lymphocytes: 18 %
MCH: 23.5 pg — ABNORMAL LOW (ref 26.6–33.0)
MCHC: 33.1 g/dL (ref 31.5–35.7)
MCV: 71 fL — ABNORMAL LOW (ref 79–97)
MONOCYTES: 8 %
NEUTROPHILS: 72 %
PLATELET: 273 10*3/uL (ref 150–379)
RBC: 3.92 x10E6/uL (ref 3.77–5.28)
RDW: 15.2 % (ref 12.3–15.4)
WBC: 6.9 10*3/uL (ref 3.4–10.8)

## 2017-11-28 ENCOUNTER — Encounter

## 2017-11-28 NOTE — Telephone Encounter (Signed)
Call placed to patient regarding recent CBC with low Hgb (9.2) and referral sent to hematology. They should be calling you to schedule an appointment. Please call back with any questions or concerns!

## 2017-12-07 ENCOUNTER — Ambulatory Visit
Admit: 2017-12-07 | Discharge: 2017-12-07 | Payer: PRIVATE HEALTH INSURANCE | Attending: Obstetrics & Gynecology | Primary: Family Medicine

## 2017-12-07 ENCOUNTER — Encounter: Admit: 2017-12-07 | Discharge: 2017-12-07 | Payer: PRIVATE HEALTH INSURANCE | Primary: Family Medicine

## 2017-12-07 DIAGNOSIS — Z3A28 28 weeks gestation of pregnancy: Secondary | ICD-10-CM

## 2017-12-07 LAB — AMB POC OB URINE DIP
Glucose (UA POC): NEGATIVE
Protein (UA POC): NEGATIVE

## 2017-12-07 MED ORDER — IRON ASP-G 75 MG IRON-FOLATE1 1 MG-C 175 MG-B12 12 MCG-ZINC-DSS TABLET
75 mg iron- 1 mg-1 mg | ORAL_TABLET | Freq: Every day | ORAL | 6 refills | Status: AC
Start: 2017-12-07 — End: ?

## 2017-12-07 NOTE — Telephone Encounter (Signed)
Patient agrees to go to hematology at todays appt.  They have made several attempts to contact her.  They also mailed her a letter with their number for her to call and schedule.  Called patient and advised her to call the number in her letter to schedule appt.  Pt states she will call them.

## 2017-12-07 NOTE — Progress Notes (Signed)
Anemia still seen at last visit.  Pt agrees to see hematology. Not taking oral iron.  Samples and rx given.  Will re-refer to hematology. ptl precautions. glucola and hg today.  rtc 4 weeks.

## 2017-12-07 NOTE — Progress Notes (Signed)
1 hour GTT stick @ 10:42.   No complaints.

## 2017-12-07 NOTE — Progress Notes (Signed)
Hg 9.5.  Continue oral iron given yesterday daily - do not take with calcium.  glucola wnl - file to ob chart.

## 2017-12-08 LAB — GLUCOSE, GESTATIONAL 1 HR TOLERANCE: Gestational Diabetes Screen: 100 mg/dL (ref 65–139)

## 2017-12-08 LAB — HEMOGLOBIN: HGB: 9.5 g/dL — ABNORMAL LOW (ref 11.1–15.9)

## 2017-12-26 NOTE — L&D Delivery Note (Signed)
Delivery Summary  Patient: Jessica Davies MRN: [id number]  SSN: [id number]    Date of Birth: 6/3/Senaida Ores1992  Age: 27 y.o.  Sex: female       Information for the patient's newborn:  Santiago BurMarshall, BOY Jream [[id number]][[id number]]       Labor Events:   Preterm Labor: No   Rupture Date: 02/16/2018   Rupture Time: 10:20 AM   Rupture Type AROM   Amniotic Fluid Volume: Moderate    Amniotic Fluid Description: Clear None   Induction: None       Augmentation: Oxytocin   Labor Events: None     Cervical Ripening:     None     Delivery Events:  Episiotomy: None   Laceration(s): Right periurethral;Left periurethral     Repaired: Yes    Number of Repair Packets: 1   Suture Type and Size: Chromic 3-0     Estimated Blood Loss (ml): 200ml       Delivery Date: 02/16/2018    Delivery Time: 11:45 AM  Delivery Type: Vaginal, Spontaneous  Sex:  Female     Gestational Age: 7476w3d   Delivery Clinician:  Benedict Needyodd R Tammra Pressman  Living Status: Living   Delivery Location: L&D 434          APGARS  One minute Five minutes Ten minutes   Skin color: 0   1        Heart rate: 2   2        Grimace: 2   2        Muscle tone: 2   2        Breathing: 2   2        Totals: 8   9            Presentation: Vertex    Position: Left Occiput Anterior  Resuscitation Method:  Tactile Stimulation;Suctioning-bulb     Meconium Stained: None      Cord Information: 3 Vessels  Complications: None  Cord around:    Delayed cord clamping? Yes  Cord clamped date/time:   Disposition of Cord Blood: Lab    Blood Gases Sent?: Yes    Placenta:  Date/Time: 02/16/2018 11:50 AM  Removal: Spontaneous      Appearance: Normal;Intact     Newborn Measurements:  Birth Weight: 6 lb 11.8 oz (3.055 kg)      Birth Length: 1' 8.47" (0.52 m)      Head Circumference: 1' 0.4" (0.315 m)      Chest Circumference: 1' 0.4" (0.315 m)     Abdominal Girth:      Other Providers:   Elmore GuiseLANTZ, Sinclair Arrazola R;WHALEY, ANNYCE B;COYNE, JODI L;;;;;;ARNETT, WHITNEY;STEPHENS, MELISSA A, Obstetrician;Primary Nurse;Primary Newborn  Nurse;Nicu Nurse;Neonatologist;Anesthesiologist;Crna;Nurse Practitioner;Charge Nurse;Scrub Tech           Group B Strep:   Lab Results   Component Value Date/Time    GrBStrep, External positive 02/01/2018     Information for the patient's newborn:  Santiago BurMarshall, BOY Posie [[id number]][[id number]]   No results found for: ABORH, PCTABR, PCTDIG, BILI, ABORHEXT, ABORH    No results for input(s): PCO2CB, PO2CB, HCO3I, SO2I, IBD, PTEMPI, SPECTI, PHICB, ISITE, IDEV, IALLEN in the last 72 hours.

## 2017-12-26 NOTE — L&D Delivery Note (Signed)
Delivery Summary    Patient: Jessica Davies MRN: [id number]  SSN: [id number]    Date of Birth: 12/15/1991  Age: 27 y.o.  Sex: female       Information for the patient's newborn:  Santiago BurMarshall, BOY Pantera [[id number]][[id number]]       Labor Events:   Preterm Labor: No   Rupture Date: 02/16/2018   Rupture Time: 10:20 AM   Rupture Type AROM   Amniotic Fluid Volume: Moderate    Amniotic Fluid Description: Clear None   Induction: None       Augmentation: Oxytocin   Labor Events: None     Cervical Ripening:     None     Delivery Events:  Episiotomy: None   Laceration(s): Right periurethral;Left periurethral     Repaired: Yes    Number of Repair Packets: 1   Suture Type and Size: Chromic 3-0     Estimated Blood Loss (ml): 200ml       Delivery Date: 02/16/2018    Delivery Time: 11:45 AM  Delivery Type: Vaginal, Spontaneous  Sex:  Female     Gestational Age: 3116w3d   Delivery Clinician:  Benedict Needyodd R Ellizabeth Dacruz  Living Status: Living   Delivery Location: L&D 434          APGARS  One minute Five minutes Ten minutes   Skin color: 0   1        Heart rate: 2   2        Grimace: 2   2        Muscle tone: 2   2        Breathing: 2   2        Totals: 8   9            Presentation: Vertex    Position: Left Occiput Anterior  Resuscitation Method:  Tactile Stimulation;Suctioning-bulb     Meconium Stained: None      Cord Information: 3 Vessels  Complications: None  Cord around:    Delayed cord clamping? Yes  Cord clamped date/time:   Disposition of Cord Blood: Lab    Blood Gases Sent?: Yes    Placenta:  Date/Time: 02/16/2018 11:50 AM  Removal: Spontaneous      Appearance: Normal;Intact     Newborn Measurements:  Birth Weight: 6 lb 11.8 oz (3.055 kg)      Birth Length: 1' 8.47" (0.52 m)      Head Circumference: 1' 0.4" (0.315 m)      Chest Circumference: 1' 0.4" (0.315 m)     Abdominal Girth:      Other Providers:   Elmore GuiseLANTZ, Kevia Zaucha R;WHALEY, ANNYCE B;COYNE, JODI L;;;;;;ARNETT, WHITNEY;STEPHENS, MELISSA A, Obstetrician;Primary Nurse;Primary Newborn Nurse;Nicu  Nurse;Neonatologist;Anesthesiologist;Crna;Nurse Practitioner;Charge Nurse;Scrub Tech           Group B Strep:   Lab Results   Component Value Date/Time    GrBStrep, External positive 02/01/2018     Information for the patient's newborn:  Santiago BurMarshall, BOY Taylour [[id number]][[id number]]   No results found for: ABORH, PCTABR, PCTDIG, BILI, ABORHEXT, ABORH    No results for input(s): PCO2CB, PO2CB, HCO3I, SO2I, IBD, PTEMPI, SPECTI, PHICB, ISITE, IDEV, IALLEN in the last 72 hours.

## 2018-01-04 ENCOUNTER — Ambulatory Visit
Admit: 2018-01-04 | Discharge: 2018-01-04 | Payer: PRIVATE HEALTH INSURANCE | Attending: Obstetrics & Gynecology | Primary: Family Medicine

## 2018-01-04 DIAGNOSIS — Z3A32 32 weeks gestation of pregnancy: Secondary | ICD-10-CM

## 2018-01-04 LAB — AMB POC OB URINE DIP: Glucose (UA POC): NEGATIVE

## 2018-01-04 MED ORDER — TINIDAZOLE 500 MG TABLET
500 mg | ORAL_TABLET | Freq: Two times a day (BID) | ORAL | 0 refills | Status: AC
Start: 2018-01-04 — End: 2018-01-10

## 2018-01-04 NOTE — Progress Notes (Signed)
+  BH  +headaches off & on

## 2018-01-04 NOTE — Progress Notes (Signed)
Pt diagnosed with trich at urgent care and UTI, rec'd shot. Rx for tindamax and send urine for culture. Didn't get Fe Rx, and has not been seen by hematology yet; Slow Fe for now

## 2018-01-06 LAB — CULTURE, URINE
Urine Culture, Routine: NO GROWTH
Urine Culture, Routine: NO GROWTH

## 2018-01-15 NOTE — Telephone Encounter (Signed)
Operator: Ve    OB patient at 2368w5d called the after hours answering service with complaints of cramping. States two days ago she had some vaginal spotting. Cramping today is not time able and "kinda painful". States cramping is located in her lower abdomen. Denies any LOF, current VB, constipation, or urinary symptoms. +FM. Patient has not tried taking tylenol or increasing water intake. Patient advised to rest, increase water intake, take tylenol as directed, and can use a heating pad on low setting on her back. [facility] if sxs not improved with above measures, worsen, or at any time she feels she needs to be seen. All questions answered and she voiced full understanding.

## 2018-01-18 ENCOUNTER — Ambulatory Visit
Admit: 2018-01-18 | Discharge: 2018-01-18 | Payer: PRIVATE HEALTH INSURANCE | Attending: Family | Primary: Family Medicine

## 2018-01-18 DIAGNOSIS — Z1389 Encounter for screening for other disorder: Secondary | ICD-10-CM

## 2018-01-18 LAB — AMB POC OB URINE DIP
Glucose (UA POC): NEGATIVE
Protein (UA POC): NEGATIVE

## 2018-01-18 NOTE — Progress Notes (Signed)
Doing well. No complaints. PTL precautions and kick counts reviewed. RTC 2 weeks for gbs.

## 2018-01-18 NOTE — Progress Notes (Signed)
+  cramps

## 2018-02-01 LAB — GYN RAPID GP B STREP, EXTERNAL: GrBStrep, External: POSITIVE

## 2018-02-02 ENCOUNTER — Ambulatory Visit
Admit: 2018-02-02 | Discharge: 2018-02-02 | Payer: PRIVATE HEALTH INSURANCE | Attending: Obstetrics & Gynecology | Primary: Family Medicine

## 2018-02-02 DIAGNOSIS — Z3A36 36 weeks gestation of pregnancy: Secondary | ICD-10-CM

## 2018-02-02 LAB — AMB POC HEMOGLOBIN (HGB): Hemoglobin (POC): 9

## 2018-02-02 LAB — AMB POC OB URINE DIP
Glucose (UA POC): NEGATIVE
Protein (UA POC): NEGATIVE

## 2018-02-02 NOTE — Progress Notes (Signed)
gbs positive. Update chart.  tx in labor

## 2018-02-02 NOTE — Progress Notes (Signed)
Kick counts and ptl precautions.  gbs today.  Taking iron.  Hg today.  sve above.  Baby's head low.  Watching fundal height.  Hg 9 on fingerstick. Will send to hematology for iv iron.

## 2018-02-02 NOTE — Progress Notes (Signed)
+   Cramping  + Ctx

## 2018-02-04 LAB — GROUP B STREP BY NAA: Strep Gp B NAA: POSITIVE — AB

## 2018-02-05 NOTE — Progress Notes (Signed)
New Patient Abstract    Reason for Referral: [redacted] weeks gestation of pregnancy; Low Iron     Referring Provider: Dr. Merry ProudBrandi Alt    Primary Care Provider: Janean SarkEberly, John B, MD    Family History of Cancer/Hematologic Disorders:  Family history significant for father and maternal grandfather with unspecified type of cancer.     Presenting Symptoms:  Intermittent headaches; persistently low H&H on labs    Narrative with recent with Results/Procedures/Biopsies and Dates completed: Ms. Jessica Davies is a 27 year old, G1 90P0000, black female who is 36w 6d pregnant with an estimated date of delivery of 02/27/18. She has a medical history significant only for anemia. She is followed by OB-GYN, Dr. Wilhelmenia BlaseBrandi Alt, [facility], and was also referred to [facility]. Howell RucksPhillip Grieg [facility] for Thickened Nuchal Fold noted on ultrasound in primary OB's office (on 10/26 was 7.9 mm at 6517w3d).    Initial prenatal labs, drawn by OB on 08/23/17, were significant for HGB 10.1, HCT 31.9, MCV 71, MCH 22.6 and RDW 16.6. HGB solubility was negative. On 08/29/17, patient was advised to start daily iron supplementation with OTC slow release iron. CBC was repeated on 11/24/17 with results significant for HGB 9.2, HCT 27.8, MCV 71 and MCH 23.5. On 11/28/18, patient was initially referred to hematology for evaluation and consideration for IV iron infusions. At this time, several attempts were made to contact patient by phone and ultimately letter was sent to patient after she was unable to be reached to set up hematology appointment.     Hemoglobin was rechecked on 12/07/17, and noted to be low at 9.5. Patient admitted that she had not been taking oral iron supplements as advised, and that [facility]. She was given samples of slow Fe and a prescription for iron supplements, and [facility].      Ms. Jessica Davies was seen again by OB on 01/04/18 where it is noted that she did not fill her iron prescription and had [facility]. POC hemoglobin checked at next OB visit on 02/02/18 was 9.0, and [facility] and consideration for IV iron infusions.      07/04/2017 18:52 08/23/2017 11:44 11/24/2017 09:58 12/07/2017 10:37 02/02/2018   WBC 6.2 6.0 6.9     RBC 4.18 4.47 3.92     HGB 9.5 (L) 10.1 (L) 9.2 (L) 9.5 (L)    HGB (POC)     9.0   HCT 29.1 (L) 31.9 (L) 27.8 (L)     Hct, External        MCV 69.6 (L) 71 (L) 71 (L)     MCH 22.7 (L) 22.6 (L) 23.5 (L)     MCHC 32.6 31.7 33.1     RDW 14.4 16.6 (H) 15.2     PLATELET 318 321 273     Platelet cnt., External        MPV 10.0 (L)       NEUTROPHILS 62 76 72     LYMPHOCYTES 29       Lymphocytes  16 18     MONOCYTES 7 7 8      EOSINOPHILS 1 1 1      BASOPHILS 0 0 0     IMMATURE GRANULOCYTES 1 0 1     DF AUTOMATED       ABS. NEUTROPHILS 3.9 4.6 5.0  ABS. IMM. GRANS. 0.0 0.0 0.1     ABS. LYMPHOCYTES 1.8       Abs Lymphocytes  0.9 1.2     ABS. MONOCYTES 0.4 0.4 0.5     ABS. EOSINOPHILS 0.1 0.0 0.1     ABS. BASOPHILS 0.0 0.0 0.0       HGB SOLUBILITY W/FEFL [facility] 08/23/2017  Component Value Flag Ref Range Units Status   Hgb Solubility Negative   Negative  Final   Notes from Referring Provider: Pt to be seen ASAP for iron infusions in pregnancy.    Other Pertinent Information: None    Presented at Tumor Board: N/A

## 2018-02-07 ENCOUNTER — Encounter

## 2018-02-07 ENCOUNTER — Ambulatory Visit
Admit: 2018-02-07 | Discharge: 2018-02-07 | Payer: PRIVATE HEALTH INSURANCE | Attending: Pediatric Hematology-Oncology | Primary: Family Medicine

## 2018-02-07 ENCOUNTER — Inpatient Hospital Stay: Admit: 2018-02-07 | Payer: PRIVATE HEALTH INSURANCE | Primary: Family Medicine

## 2018-02-07 DIAGNOSIS — O99013 Anemia complicating pregnancy, third trimester: Secondary | ICD-10-CM

## 2018-02-07 LAB — METABOLIC PANEL, COMPREHENSIVE
A-G Ratio: 0.6 — ABNORMAL LOW (ref 1.2–3.5)
ALT (SGPT): 22 U/L (ref 12–65)
AST (SGOT): 15 U/L (ref 15–37)
Albumin: 2.9 g/dL — ABNORMAL LOW (ref 3.5–5.0)
Alk. phosphatase: 134 U/L (ref 50–136)
Anion gap: 5 mmol/L — ABNORMAL LOW (ref 7–16)
BUN: 12 MG/DL (ref 6–23)
Bilirubin, total: 0.3 MG/DL (ref 0.2–1.1)
CO2: 26 mmol/L (ref 21–32)
Calcium: 8.8 MG/DL (ref 8.3–10.4)
Chloride: 106 mmol/L (ref 98–107)
Creatinine: 0.82 MG/DL (ref 0.6–1.0)
GFR est AA: >60 mL/min/{1.73_m2} (ref >60)
GFR est non-AA: >60 mL/min/{1.73_m2} (ref >60)
Globulin: 4.9 g/dL — ABNORMAL HIGH (ref 2.3–3.5)
Glucose: 90 mg/dL (ref 65–100)
Potassium: 3.5 mmol/L (ref 3.5–5.1)
Protein, total: 7.8 g/dL (ref 6.3–8.2)
Sodium: 137 mmol/L (ref 136–145)

## 2018-02-07 LAB — URINALYSIS W/ RFLX MICROSCOPIC
Bilirubin: NEGATIVE
Blood: NEGATIVE
Glucose: 100 mg/dL — AB
Ketone: NEGATIVE mg/dL
Nitrites: NEGATIVE
Protein: NEGATIVE mg/dL
Specific gravity: 1.015 (ref 1.001–1.023)
Urobilinogen: 0.2 EU/dL (ref 0.2–1.0)
pH (UA): 7 (ref 5.0–9.0)

## 2018-02-07 LAB — URINE MICROSCOPIC
Casts: 0 /lpf
Crystals, urine: 0 /LPF
Mucus: 0 /lpf
RBC: 0 /hpf

## 2018-02-07 LAB — RETICULOCYTE COUNT
Absolute Retic Cnt.: 0.0833 M/ul (ref 0.026–0.095)
Immature Retic Fraction: 18.1 % — ABNORMAL HIGH (ref 3.0–15.9)
Retic Hgb Conc.: 27 pg — ABNORMAL LOW (ref 29–35)
Reticulocyte count: 1.7 % (ref 0.3–2.0)

## 2018-02-07 LAB — CBC WITH AUTOMATED DIFF
ABS. BASOPHILS: 0 10*3/uL (ref 0.0–0.2)
ABS. EOSINOPHILS: 0 10*3/uL (ref 0.0–0.8)
ABS. IMM. GRANS.: 0.1 10*3/uL (ref 0.0–0.5)
ABS. LYMPHOCYTES: 1 10*3/uL (ref 0.5–4.6)
ABS. MONOCYTES: 0.4 10*3/uL (ref 0.1–1.3)
ABS. NEUTROPHILS: 5.1 10*3/uL (ref 1.7–8.2)
ABSOLUTE NRBC: 0 10*3/uL (ref 0.0–0.2)
BASOPHILS: 1 % (ref 0.0–2.0)
EOSINOPHILS: 0 % — ABNORMAL LOW (ref 0.5–7.8)
HCT: 36 % (ref 35.8–46.3)
HGB: 11.5 g/dL — ABNORMAL LOW (ref 11.7–15.4)
IMMATURE GRANULOCYTES: 2 % (ref 0.0–5.0)
LYMPHOCYTES: 16 % (ref 13–44)
MCH: 23.6 PG — ABNORMAL LOW (ref 26.1–32.9)
MCHC: 31.9 g/dL (ref 31.4–35.0)
MCV: 73.9 FL — ABNORMAL LOW (ref 79.6–97.8)
MONOCYTES: 6 % (ref 4.0–12.0)
MPV: 11 FL (ref 9.4–12.3)
NEUTROPHILS: 76 % (ref 43–78)
PLATELET: 247 10*3/uL (ref 150–450)
RBC: 4.87 M/uL (ref 4.05–5.25)
RDW: 14.8 % — ABNORMAL HIGH (ref 11.9–14.6)
WBC: 6.6 10*3/uL (ref 4.3–11.1)

## 2018-02-07 LAB — VITAMIN B12: Vitamin B12: 529 pg/mL (ref 193–986)

## 2018-02-07 LAB — LD: LD: 118 U/L (ref 100–190)

## 2018-02-07 LAB — URIC ACID: Uric acid: 4.2 MG/DL (ref 2.6–6.0)

## 2018-02-07 LAB — TRANSFERRIN SATURATION
Iron: 113 ug/dL (ref 35–150)
TIBC: 501 ug/dL — ABNORMAL HIGH (ref 250–450)
Transferrin Saturation: 23 % (ref >20)

## 2018-02-07 LAB — FERRITIN: Ferritin: 18 NG/ML (ref 8–388)

## 2018-02-07 LAB — T4, FREE: T4, Free: 1 NG/DL (ref 0.78–1.46)

## 2018-02-07 LAB — PERIPHERAL SMEAR

## 2018-02-07 LAB — TSH 3RD GENERATION: TSH: 1.1 u[IU]/mL (ref 0.358–3.740)

## 2018-02-07 LAB — FOLATE: Folate: 32.7 ng/mL — ABNORMAL HIGH (ref 3.1–17.5)

## 2018-02-07 LAB — SED RATE, AUTOMATED: Sed rate, automated: 57 mm/hr — ABNORMAL HIGH (ref 0–20)

## 2018-02-07 NOTE — Progress Notes (Signed)
HISTORY OF PRESENT ILLNESS  Jessica Davies is a 27 y.o. female referred for evaluation of IDA in pregnancy.  Reason for Referral: [redacted] weeks gestation of pregnancy; Low Iron   ??  Referring Provider: Dr. Velna Hatchet Alt  ??  Primary Care Provider: Marzetta Board, MD  ??  Family History of Cancer/Hematologic Disorders:  Family history significant for father and maternal grandfather with unspecified type of cancer.   ??  Presenting Symptoms: ??Intermittent headaches; persistently low H&H on labs  ??  Narrative with recent with Results/Procedures/Biopsies and Dates completed: Jessica Davies is a 27 year old, G1 P49, black female who is 36w 6d pregnant with an estimated date of delivery of 02/27/18. She has a medical history significant only for anemia. She is followed by OB-GYN, Dr. Ricardo Jericho, at [place], and was also referred to Dr. Manuela Schwartz at [facility] for Thickened Nuchal Fold noted on ultrasound in primary OB's office (on 10/26 was 7.9 mm at [redacted]w[redacted]d.  ??  Initial prenatal labs, drawn by OB on 08/23/17, were significant for HGB 10.1, HCT 31.9, MCV 71, MCH 22.6 and RDW 16.6. HGB solubility was negative. On 08/29/17, patient was advised to start daily iron supplementation with OTC slow release iron. CBC was repeated on 11/24/17 with results significant for HGB 9.2, HCT 27.8, MCV 71 and MCH 23.5. On 11/28/18, patient was initially referred to hematology for evaluation and consideration for IV iron infusions. At this time, several attempts were made to contact patient by phone and ultimately letter was sent to patient after she was unable to be reached to set up hematology appointment.   ??  Hemoglobin was rechecked on 12/07/17, and noted to be low at 9.5. Patient admitted that she had not been taking oral iron supplements as advised, and [facility]. She was given samples of slow Fe and a prescription for iron supplements, and  [facility]   ??  Ms. MRostenwas seen again by OB on 01/04/18 where it is noted that she did not fill her iron prescription and [facility]. POC hemoglobin checked at next OB visit on 02/02/18 was 9.0, and [facility].   ??  ?? 07/04/2017 18:52 08/23/2017 11:44 11/24/2017 09:58 12/07/2017 10:37 02/02/2018   WBC 6.2 6.0 6.9 ?? ??   RBC 4.18 4.47 3.92 ?? ??   HGB 9.5 (L) 10.1 (L) 9.2 (L) 9.5 (L) ??   HGB (POC) ?? ?? ?? ?? 9.0   HCT 29.1 (L) 31.9 (L) 27.8 (L) ?? ??   Hct, External ?? ?? ?? ?? ??   MCV 69.6 (L) 71 (L) 71 (L) ?? ??   MCH 22.7 (L) 22.6 (L) 23.5 (L) ?? ??   MCHC 32.6 31.7 33.1 ?? ??   RDW 14.4 16.6 (H) 15.2 ?? ??   PLATELET 318 321 273 ?? ??   Platelet cnt., External ?? ?? ?? ?? ??   MPV 10.0 (L) ?? ?? ?? ??   NEUTROPHILS 62 76 72 ?? ??   LYMPHOCYTES 29 ?? ?? ?? ??   Lymphocytes ?? 16 18 ?? ??   MONOCYTES 7 7 8  ?? ??   EOSINOPHILS 1 1 1  ?? ??   BASOPHILS 0 0 0 ?? ??   IMMATURE GRANULOCYTES 1 0 1 ?? ??  DF AUTOMATED ?? ?? ?? ??   ABS. NEUTROPHILS 3.9 4.6 5.0 ?? ??   ABS. IMM. GRANS. 0.0 0.0 0.1 ?? ??   ABS. LYMPHOCYTES 1.8 ?? ?? ?? ??   Abs Lymphocytes ?? 0.9 1.2 ?? ??   ABS. MONOCYTES 0.4 0.4 0.5 ?? ??   ABS. EOSINOPHILS 0.1 0.0 0.1 ?? ??   ABS. BASOPHILS 0.0 0.0 0.0 ?? ??   ??  HGB SOLUBILITY W/FEFL [facility] 08/23/2017  Component Value Flag Ref Range Units Status   Hgb Solubility Negative  ?? Negative ?? Final   ??  Notes from Referring Provider: Pt to be seen ASAP for iron infusions in pregnancy.      HPI referred early for IV iron but patient was not able to make appt.  Found recently on POC to have hgb 9.5 near 37 weeks and finally able to make it in, this was a finger stick  Reports 1 episode of "blacking out" 2 months ago, but no other symptoms  Specifically denies fatigue, PICA, ha, syncope, SOB, chest pain, resltless legs, poor sleep  HPI-Menstruation: any defines HMB (heavy menstrual bleeding)   ? >7 days   ? soaking through pad/tampon <2 hours  ? soaking clothes/bedclothes  ? passing clots  ? iron deficiency  ? anemia  No Past Medical History of   Structural Issues (PALM):  Polyps (uterine), Adenomyosis, Lieomyoma, Malignancy  Non Structural Issues (COEI): Coagulopathy, Ovulatory bleeding, endometrial, iatrogenic  Current Outpatient Medications   Medication Sig   ??? iron-folate no1-C-B12-zinc-dss (FERIVA 21-7 TABLET) 75 mg iron- 1 mg-175 mg tab Take 1 Tab by mouth daily.   ??? [id number] chel-folic-dha (PRENATAL DHA+COMPLETE PRENATAL) 30-975-300 mg-mcg-mg cmpk Take  by mouth.     No current facility-administered medications for this visit.      Past Medical History:   Diagnosis Date   ??? Anemia    ??? Chlamydia 09/04/2017     Past Surgical History:   Procedure Laterality Date   ??? PR EXTRAC ERUPTED TOOTH/EXPOSED ROOT       Social History     Socioeconomic History   ??? Marital status: SINGLE     Spouse name: Not on file   ??? Number of children: Not on file   ??? Years of education: Not on file   ??? Highest education level: Not on file   Social Needs   ??? Financial resource strain: Not on file   ??? Food insecurity - worry: Not on file   ??? Food insecurity - inability: Not on file   ??? Transportation needs - medical: Not on file   ??? Transportation needs - non-medical: Not on file   Occupational History   ??? Not on file   Tobacco Use   ??? Smoking status: Never Smoker   ??? Smokeless tobacco: Never Used   Substance and Sexual Activity   ??? Alcohol use: No     Frequency: Never   ??? Drug use: No   ??? Sexual activity: Yes     Partners: Male   Other Topics Concern   ??? Not on file   Social History Narrative   ??? Not on file     Family History   Problem Relation Age of Onset   ??? MS Mother    ??? Cancer Father    ??? Cancer Maternal Grandfather    ??? Diabetes Sister         pancreas/kidney transplant     No Known Allergies   Immunization History  Administered Date(s) Administered   ??? Influenza Vaccine (Quad) PF 09/28/2017    ??? TB Skin Test (PPD) Intradermal 07/11/2017       ROS  Review of Systems   Constitutional: Negative.    HENT: Negative.    Eyes: Negative.    Respiratory: Negative.    Cardiovascular: Negative.    Gastrointestinal: Negative.    Genitourinary: Negative.    Musculoskeletal: Negative.    Skin: Negative.    Neurological: Negative.    Endo/Heme/Allergies: Negative.    Psychiatric/Behavioral: Negative.      Visit Vitals  BP 115/65 (BP 1 Location: Left arm, BP Patient Position: Standing)   Pulse 82   Temp 98.7 ??F (37.1 ??C) (Oral)   Resp 18   Ht 5' 8.75" (1.746 m)   Wt 164 lb 11.2 oz (74.7 kg)   SpO2 97%   BMI 24.50 kg/m??       Physical Exam  Constitutional: Well developed, well nourished female in no acute distress, sitting comfortably, pregnant   HEENT: Normocephalic and atraumatic. Oropharynx is clear, mucous membranes are moist.  Pupils are equal, round, and reactive to light. Extraocular muscles are intact.  Sclerae anicteric. Neck supple without JVD. No thyromegaly present.    Lymph node   No palpable submandibular, cervical, supraclavicular, axillary or inguinal lymph nodes.   Skin Warm and dry.  No bruising and no rash noted.  No erythema.  No pallor.    Respiratory Lungs are clear to auscultation bilaterally without wheezes, rales or rhonchi, normal air exchange without accessory muscle use.    CVS Normal rate, regular rhythm and normal S1 and S2.  No murmurs, gallops, or rubs.   Abdomen Soft, nontender and nondistended, normoactive bowel sounds.  No palpable mass.  No hepatosplenomegaly.   Neuro Grossly nonfocal with no obvious sensory or motor deficits.   MSK Normal range of motion in general.  No edema and no tenderness.   Psych Appropriate mood and affect.      Recent Results (from the past 12 hour(s))   URINALYSIS W/ RFLX MICROSCOPIC    Collection Time: 02/07/18  1:50 PM   Result Value Ref Range    Color YELLOW      Appearance CLEAR      Specific gravity 1.015 1.001 - 1.023      pH (UA) 7.0 5.0 - 9.0       Protein NEGATIVE  NEG mg/dL    Glucose 100 (A) NEG mg/dL    Ketone NEGATIVE  NEG mg/dL    Bilirubin NEGATIVE  NEG      Blood NEGATIVE  NEG      Urobilinogen 0.2 0.2 - 1.0 EU/dL    Nitrites NEGATIVE  NEG      Leukocyte Esterase TRACE (A) NEG     URINE MICROSCOPIC    Collection Time: 02/07/18  1:50 PM   Result Value Ref Range    WBC 0-3 0 /hpf    RBC 0 0 /hpf    Epithelial cells 0-3 0 /hpf    Bacteria TRACE 0 /hpf    Casts 0 0 /lpf    Crystals, urine 0 0 /LPF    Mucus 0 0 /lpf   T4, FREE    Collection Time: 02/07/18  1:57 PM   Result Value Ref Range    T4, Free 1.0 0.78 - 1.46 NG/DL   TSH 3RD GENERATION    Collection Time: 02/07/18  1:57 PM   Result Value Ref Range    TSH  1.100 0.358 - 3.740 uIU/mL   URIC ACID    Collection Time: 02/07/18  1:57 PM   Result Value Ref Range    Uric acid 4.2 2.6 - 6.0 MG/DL   RETICULOCYTE COUNT    Collection Time: 02/07/18  1:57 PM   Result Value Ref Range    Reticulocyte count 1.7 0.3 - 2.0 %    Absolute Retic Cnt. 0.0833 0.026 - 0.095 M/ul    Immature Retic Fraction 18.1 (H) 3.0 - 15.9 %    Retic Hgb Conc. 27 (L) 29 - 35 pg   LD    Collection Time: 02/07/18  1:57 PM   Result Value Ref Range    LD 118 100 - 190 U/L   FERRITIN    Collection Time: 02/07/18  1:57 PM   Result Value Ref Range    Ferritin 18 8 - 388 NG/ML   TRANSFERRIN SATURATION    Collection Time: 02/07/18  1:57 PM   Result Value Ref Range    Iron 113 35 - 150 ug/dL    TIBC 501 (H) 250 - 450 ug/dL    Transferrin Saturation 23 >70 %   METABOLIC PANEL, COMPREHENSIVE    Collection Time: 02/07/18  1:57 PM   Result Value Ref Range    Sodium 137 136 - 145 mmol/L    Potassium 3.5 3.5 - 5.1 mmol/L    Chloride 106 98 - 107 mmol/L    CO2 26 21 - 32 mmol/L    Anion gap 5 (L) 7 - 16 mmol/L    Glucose 90 65 - 100 mg/dL    BUN 12 6 - 23 MG/DL    Creatinine 0.82 0.6 - 1.0 MG/DL    GFR est AA >60 >60 ml/min/1.24m    GFR est non-AA >60 >60 ml/min/1.769m   Calcium 8.8 8.3 - 10.4 MG/DL    Bilirubin, total 0.3 0.2 - 1.1 MG/DL     ALT (SGPT) 22 12 - 65 U/L    AST (SGOT) 15 15 - 37 U/L    Alk. phosphatase 134 50 - 136 U/L    Protein, total 7.8 6.3 - 8.2 g/dL    Albumin 2.9 (L) 3.5 - 5.0 g/dL    Globulin 4.9 (H) 2.3 - 3.5 g/dL    A-G Ratio 0.6 (L) 1.2 - 3.5     CBC WITH AUTOMATED DIFF    Collection Time: 02/07/18  1:57 PM   Result Value Ref Range    WBC 6.6 4.3 - 11.1 K/uL    RBC 4.87 4.05 - 5.25 M/uL    HGB 11.5 (L) 11.7 - 15.4 g/dL    HCT 36.0 35.8 - 46.3 %    MCV 73.9 (L) 79.6 - 97.8 FL    MCH 23.6 (L) 26.1 - 32.9 PG    MCHC 31.9 31.4 - 35.0 g/dL    RDW 14.8 (H) 11.9 - 14.6 %    PLATELET 247 150 - 450 K/uL    MPV 11.0 9.4 - 12.3 FL    ABSOLUTE NRBC 0.00 0.0 - 0.2 K/uL    DF AUTOMATED      NEUTROPHILS 76 43 - 78 %    LYMPHOCYTES 16 13 - 44 %    MONOCYTES 6 4.0 - 12.0 %    EOSINOPHILS 0 (L) 0.5 - 7.8 %    BASOPHILS 1 0.0 - 2.0 %    IMMATURE GRANULOCYTES 2 0.0 - 5.0 %    ABS. NEUTROPHILS 5.1 1.7 - 8.2 K/UL  ABS. LYMPHOCYTES 1.0 0.5 - 4.6 K/UL    ABS. MONOCYTES 0.4 0.1 - 1.3 K/UL    ABS. EOSINOPHILS 0.0 0.0 - 0.8 K/UL    ABS. BASOPHILS 0.0 0.0 - 0.2 K/UL    ABS. IMM. GRANS. 0.1 0.0 - 0.5 K/UL       Patient Active Problem List   Diagnosis Code   ??? High-risk pregnancy in second trimester O09.92   ??? Anemia affecting pregnancy O99.019   ??? Chlamydia infection affecting pregnancy O98.819, A74.9   ??? Encounter for immunization Z23   ??? Nuchal fold thickening determined by ultrasound O35.1XX0   ??? Group beta Strep positive B95.1       ASSESSMENT and PLAN    ICD-10-CM ICD-9-CM    1. Anemia affecting pregnancy in third trimester O99.013 648.23 CBC WITH AUTOMATED DIFF     427.0 METABOLIC PANEL, COMPREHENSIVE      RETICULOCYTE COUNT      FERRITIN      TRANSFERRIN SATURATION      VON WILLEBRAND PANEL      THROMBIN TIME      FIBRINOGEN      PTT      PROTHROMBIN TIME + INR      BLOOD TYPE, (ABO+RH)     27 y.o. 37 weeks with a history of DA, with anemia improved on oral iron, but still symptomatic with iron deficiency, likely related to HMB  (increased loss)and certainly related to pregnancy (increased demand)  1) ID: related to pregnancy and and possible discussed natural history and associated symptoms.  Give iron ferrous sulfate 368m i po qDay with vitamin c source and follow up 6 monthsfor recheck.  If no response then get either oral iron challenge or iv iron indicated.  Once replete, give 3 months of iron and then stop, focusing on foods high in iron content.    2) HMB: OTC per OB improving and continue to follow up.  Bleeding diathesis work up after delivery  3) Bleeding Diathesis: approximately 10% risk of bleeding disorder, most common von willebrand or platelet function disorder.  VWD testing discussed and will repeat in 3-6 months if first normal and platelet function testing with PFA, plt agg and glycoprotein expression evaluation if VW normal.  Smear to be evaluated for gray plt or megaplts ([employer] [place].  Follow up 4-6 months post delivery   All questions answered  Total time 70 min 50% in direct consultation about the patient's diagnosis and management  HCrissie Figures MD  Director, Adolescent Young Adult Cancer Care and Blood Disorders Program  [facility] [address] [place] [address] [place] Phone [phone]

## 2018-02-07 NOTE — Patient Instructions (Signed)
Patient Instructions from Today's VisitReason for Visit:  New patient visit for iron deficiency anemia in pregnancy 37w, 1d  Currently on oral iron x5176month    Plan:  Your hemoglobin has increased and is in a good range! Great job on taking your oral iron.   Your iron studies are largely normal. If you intent to breastfeed, continue oral iron.     You don't need iv iron at this time.    Follow Up:  6 months after delivery    Recent Lab Results:Recent Results (from the past 12 hour(s))   URINALYSIS W/ RFLX MICROSCOPIC    Collection Time: 02/07/18  1:50 PM   Result Value Ref Range    Color YELLOW      Appearance CLEAR      Specific gravity 1.015 1.001 - 1.023      pH (UA) 7.0 5.0 - 9.0      Protein NEGATIVE  NEG mg/dL    Glucose 161100 (A) NEG mg/dL    Ketone NEGATIVE  NEG mg/dL    Bilirubin NEGATIVE  NEG      Blood NEGATIVE  NEG      Urobilinogen 0.2 0.2 - 1.0 EU/dL    Nitrites NEGATIVE  NEG      Leukocyte Esterase TRACE (A) NEG     URINE MICROSCOPIC    Collection Time: 02/07/18  1:50 PM   Result Value Ref Range    WBC 0-3 0 /hpf    RBC 0 0 /hpf    Epithelial cells 0-3 0 /hpf    Bacteria TRACE 0 /hpf    Casts 0 0 /lpf    Crystals, urine 0 0 /LPF    Mucus 0 0 /lpf   RETICULOCYTE COUNT    Collection Time: 02/07/18  1:57 PM   Result Value Ref Range    Reticulocyte count 1.7 0.3 - 2.0 %    Absolute Retic Cnt. 0.0833 0.026 - 0.095 M/ul    Immature Retic Fraction 18.1 (H) 3.0 - 15.9 %    Retic Hgb Conc. 27 (L) 29 - 35 pg   CBC WITH AUTOMATED DIFF    Collection Time: 02/07/18  1:57 PM   Result Value Ref Range    WBC 6.6 4.3 - 11.1 K/uL    RBC 4.87 4.05 - 5.25 M/uL    HGB 11.5 (L) 11.7 - 15.4 g/dL    HCT 09.636.0 04.535.8 - 40.946.3 %    MCV 73.9 (L) 79.6 - 97.8 FL    MCH 23.6 (L) 26.1 - 32.9 PG    MCHC 31.9 31.4 - 35.0 g/dL    RDW 81.114.8 (H) 91.411.9 - 14.6 %    PLATELET 247 150 - 450 K/uL    MPV 11.0 9.4 - 12.3 FL    ABSOLUTE NRBC 0.00 0.0 - 0.2 K/uL    DF AUTOMATED      NEUTROPHILS 76 43 - 78 %    LYMPHOCYTES 16 13 - 44 %     MONOCYTES 6 4.0 - 12.0 %    EOSINOPHILS 0 (L) 0.5 - 7.8 %    BASOPHILS 1 0.0 - 2.0 %    IMMATURE GRANULOCYTES 2 0.0 - 5.0 %    ABS. NEUTROPHILS 5.1 1.7 - 8.2 K/UL    ABS. LYMPHOCYTES 1.0 0.5 - 4.6 K/UL    ABS. MONOCYTES 0.4 0.1 - 1.3 K/UL    ABS. EOSINOPHILS 0.0 0.0 - 0.8 K/UL    ABS. BASOPHILS 0.0 0.0 - 0.2 K/UL  ABS. IMM. GRANS. 0.1 0.0 - 0.5 K/UL       Care plan has been discussed and given to patient: NA  -------------------------------------------------------------------------------------------------------------------  Please call our office at [phone] if you have any  of the following symptoms:   ?? Fever of 100.5 or greater  ?? Chills  ?? Shortness of breath  ?? Swelling or pain in one leg    After office hours an answering service is available and will contact a provider for emergencies or if you are experiencing any of the above symptoms.    ? Patient has My Chart.  My Chart log in information explained on the after visit summary printout at the check-out desk.

## 2018-02-08 LAB — ANA REFLEX PANEL
Anti-DNA (DS) Ab, QT: 3 IU/mL (ref 0–9)
Anti-DNA (DS) Ab, QT: 3 IU/mL (ref 0–9)
Anti-Jo-1: <0.2 AI (ref 0.0–0.9)
Anti-Jo-1: <0.2 AI (ref 0.0–0.9)
Antichromatin Ab: <0.2 AI (ref 0.0–0.9)
Antichromatin Antibodies: <0.2 AI (ref 0.0–0.9)
Antiscleroderma-70 Antibodies: <0.2 AI (ref 0.0–0.9)
Centromere B Ab: <0.2 AI (ref 0.0–0.9)
Centromere B Antibody: <0.2 AI (ref 0.0–0.9)
RNP ABS: <0.2 AI (ref 0.0–0.9)
RNP Abs: <0.2 AI (ref 0.0–0.9)
Scleroderma-70 Ab: <0.2 AI (ref 0.0–0.9)
Sjogren's Anti-SS-A: 6.3 AI — ABNORMAL HIGH (ref 0.0–0.9)
Sjogren's Anti-SS-A: 6.3 AI — ABNORMAL HIGH (ref 0.0–0.9)
Sjogren's Anti-SS-B: 0.5 AI (ref 0.0–0.9)
Sjogren's Anti-SS-B: 0.5 AI (ref 0.0–0.9)
Smith Abs: <0.2 AI (ref 0.0–0.9)
Smith Abs: <0.2 AI (ref 0.0–0.9)

## 2018-02-08 LAB — HEMOGLOBIN FRACTIONATION
HEMOGLOBIN A2: 2.3 % (ref 1.8–3.2)
HEMOGLOBIN F: 0 % (ref 0.0–2.0)
HEMOGLOBIN OTHER: 0 %
HEMOGLOBIN S: 0 %
HGB A: 97.7 % (ref 96.4–98.8)
HGB SOLUBILITY: NEGATIVE
Hemoglobin A2: 2.3 % (ref 1.8–3.2)
Hemoglobin A: 97.7 % (ref 96.4–98.8)
Hemoglobin C: 0 %
Hemoglobin C: 0 %
Hemoglobin F: 0 % (ref 0.0–2.0)
Hemoglobin S: 0 %
Hemoglobin, Other: 0 %
Hgb Solubility: NEGATIVE

## 2018-02-08 LAB — ANA, DIRECT, W/REFLEX
ANA, Direct: POSITIVE — AB
ANA: POSITIVE — AB

## 2018-02-08 LAB — HIV 1/2 ANTIGEN/ANTIBODY, FOURTH GENERATION W/RFL: HIV Scr 4th Gen: NONREACTIVE

## 2018-02-08 LAB — RBC, FOLATE
Folate, Hemolysate: 486.4 ng/mL
Folate, RBC: 1414 ng/mL (ref >498)
HCT: 34.4 % (ref 34.0–46.6)

## 2018-02-08 LAB — SOLUBLE TRANSFERRIN RECEPTOR: Soluble Transferrin Receptor: 18.9 nmol/L (ref 12.2–27.3)

## 2018-02-08 LAB — HEPATITIS PANEL, ACUTE
Hep B Core Ab, IgM: NEGATIVE
Hep B surface Ag screen: NEGATIVE
Hep C Virus Ab: <0.1 s/co ratio (ref 0.0–0.9)
Hepatitis A Ab, IgM: NEGATIVE

## 2018-02-08 LAB — HIV 1/2 AG/AB, 4TH GENERATION,W RFLX CONFIRM: HIV Screen, 4th gen: NONREACTIVE

## 2018-02-09 ENCOUNTER — Ambulatory Visit
Admit: 2018-02-09 | Discharge: 2018-02-09 | Payer: PRIVATE HEALTH INSURANCE | Attending: Obstetrics & Gynecology | Primary: Family Medicine

## 2018-02-09 DIAGNOSIS — O0993 Supervision of high risk pregnancy, unspecified, third trimester: Secondary | ICD-10-CM

## 2018-02-09 LAB — AMB POC OB URINE DIP
Glucose (UA POC): NEGATIVE
Protein (UA POC): NEGATIVE

## 2018-02-09 NOTE — Progress Notes (Signed)
AOK.  GBS POSITIVE discussed.  Hematology declined to infuse iron.

## 2018-02-09 NOTE — Progress Notes (Signed)
No problems.

## 2018-02-15 DIAGNOSIS — O9902 Anemia complicating childbirth: Secondary | ICD-10-CM

## 2018-02-16 ENCOUNTER — Encounter: Payer: PRIVATE HEALTH INSURANCE | Attending: Obstetrics & Gynecology | Primary: Family Medicine

## 2018-02-16 ENCOUNTER — Inpatient Hospital Stay
Admit: 2018-02-16 | Discharge: 2018-02-18 | Disposition: A | Discharge status: Home / Self Care | Payer: PRIVATE HEALTH INSURANCE | Attending: Obstetrics & Gynecology | Admitting: Obstetrics & Gynecology

## 2018-02-16 LAB — TYPE AND SCREEN
ABO/Rh: B POS
Antibody Screen: NEGATIVE

## 2018-02-16 LAB — CBC W/O DIFF
ABSOLUTE NRBC: 0 10*3/uL (ref 0.0–0.2)
HCT: 32.5 % — ABNORMAL LOW (ref 35.8–46.3)
HGB: 10.5 g/dL — ABNORMAL LOW (ref 11.7–15.4)
MCH: 23.3 PG — ABNORMAL LOW (ref 26.1–32.9)
MCHC: 32.3 g/dL (ref 31.4–35.0)
MCV: 72.2 FL — ABNORMAL LOW (ref 79.6–97.8)
MPV: 11.5 FL (ref 9.4–12.3)
PLATELET: 225 10*3/uL (ref 150–450)
RBC: 4.5 M/uL (ref 4.05–5.2)
RDW: 14.7 % — ABNORMAL HIGH (ref 11.9–14.6)
WBC: 9 10*3/uL (ref 4.3–11.1)

## 2018-02-16 LAB — BLOOD GAS, CORD BLOOD
Base deficit (POC): 2 mmol/L
Base deficit (POC): 2 mmol/L
CO2, POC: 26 MMOL/L
CO2, POC: 28 MMOL/L
COLLECT TIME: 1145
COLLECT TIME: 1145
HCO3 (POC): 26.2 MMOL/L — ABNORMAL HIGH (ref 22–26)
HCO3, venous (POC): 24.6 MMOL/L (ref 23–28)
Patient temp.: 98.6
Patient temp.: 98.6
pCO2 cord blood: 46 mmHg (ref 32–68)
pCO2 cord blood: 56 mmHg (ref 32–68)
pH, cord blood (POC): 7.281 (ref 7.15–7.38)
pH, cord blood (POC): 7.334 (ref 7.15–7.38)
pO2 cord blood: 15 mmHg
pO2 cord blood: 20 mmHg
sO2 (POC): 15 % — ABNORMAL LOW (ref 95–98)
sO2, venous (POC): 28 % — ABNORMAL LOW (ref 65–88)

## 2018-02-16 LAB — TYPE & SCREEN
ABO/Rh(D): B POS
Antibody screen: NEGATIVE

## 2018-02-16 MED ORDER — SODIUM CHLORIDE 0.9 % IJ SYRG
INTRAMUSCULAR | Status: DC | PRN
Start: 2018-02-16 — End: 2018-02-16

## 2018-02-16 MED ORDER — OXYTOCIN 30 UNIT IN 500 ML INFUSION
30 unit/500 mL | INTRAVENOUS | Status: DC
Start: 2018-02-16 — End: 2018-02-16
  Administered 2018-02-16 (×5): via INTRAVENOUS

## 2018-02-16 MED ORDER — OXYTOCIN IN LACTATED RINGERS 15 UNIT/250 ML IV
15 unit/250 mL | Freq: Once | INTRAVENOUS | Status: DC
Start: 2018-02-16 — End: 2018-02-16

## 2018-02-16 MED ORDER — ROPIVACAINE (PF) 5 MG/ML (0.5 %) INJECTION
5 mg/mL (0. %) | INTRAMUSCULAR | Status: DC | PRN
Start: 2018-02-16 — End: 2018-02-16
  Administered 2018-02-16: 13:00:00 via PERINEURAL

## 2018-02-16 MED ORDER — OXYCODONE-ACETAMINOPHEN 5 MG-325 MG TAB
5-325 mg | ORAL | Status: DC | PRN
Start: 2018-02-16 — End: 2018-02-18
  Administered 2018-02-16 – 2018-02-18 (×4): via ORAL

## 2018-02-16 MED ORDER — GLYCERIN-WITCH HAZEL 12.5 %-50 % TOPICAL PADS
CUTANEOUS | Status: DC | PRN
Start: 2018-02-16 — End: 2018-02-18

## 2018-02-16 MED ORDER — LIDOCAINE HCL 1 % (10 MG/ML) IJ SOLN
10 mg/mL (1 %) | Freq: Once | INTRAMUSCULAR | Status: DC | PRN
Start: 2018-02-16 — End: 2018-02-16

## 2018-02-16 MED ORDER — ADV ADDAPTOR
5 million unit | Freq: Once | Status: AC
Start: 2018-02-16 — End: 2018-02-16
  Administered 2018-02-16: 07:00:00 via INTRAVENOUS

## 2018-02-16 MED ORDER — LACTATED RINGERS BOLUS IV
INTRAVENOUS | Status: DC | PRN
Start: 2018-02-16 — End: 2018-02-16

## 2018-02-16 MED ORDER — MINERAL OIL ORAL
Freq: Once | ORAL | Status: AC | PRN
Start: 2018-02-16 — End: 2018-02-16
  Administered 2018-02-16: 16:00:00 via TOPICAL

## 2018-02-16 MED ORDER — PENICILLIN G POT 2.5 MILLION UNITS IN 50 ML 0.9% NACL
2.5 MU | INTRAVENOUS | Status: DC
Start: 2018-02-16 — End: 2018-02-16
  Administered 2018-02-16 (×2): via INTRAVENOUS

## 2018-02-16 MED ORDER — LIDOCAINE 2 % MUCOSAL GEL
2 % | Freq: Once | Status: DC | PRN
Start: 2018-02-16 — End: 2018-02-16

## 2018-02-16 MED ORDER — BUTORPHANOL TARTRATE 2 MG/ML IJ SOLN
2 mg/mL | INTRAMUSCULAR | Status: DC | PRN
Start: 2018-02-16 — End: 2018-02-16
  Administered 2018-02-16: 07:00:00 via INTRAVENOUS

## 2018-02-16 MED ORDER — IBUPROFEN 800 MG TAB
800 mg | Freq: Four times a day (QID) | ORAL | Status: DC | PRN
Start: 2018-02-16 — End: 2018-02-18
  Administered 2018-02-16 – 2018-02-18 (×7): via ORAL

## 2018-02-16 MED ORDER — DIPHENHYDRAMINE 25 MG CAP
25 mg | ORAL | Status: DC | PRN
Start: 2018-02-16 — End: 2018-02-18

## 2018-02-16 MED ORDER — SODIUM CHLORIDE 0.9 % IJ SYRG
Freq: Three times a day (TID) | INTRAMUSCULAR | Status: DC
Start: 2018-02-16 — End: 2018-02-16

## 2018-02-16 MED ORDER — DEXTROSE 5%-LACTATED RINGERS IV
INTRAVENOUS | Status: DC
Start: 2018-02-16 — End: 2018-02-16

## 2018-02-16 MED ORDER — ONDANSETRON 4 MG TAB, RAPID DISSOLVE
4 mg | Freq: Once | ORAL | Status: AC | PRN
Start: 2018-02-16 — End: 2018-02-17

## 2018-02-16 MED ORDER — SIMETHICONE 80 MG CHEWABLE TAB
80 mg | Freq: Four times a day (QID) | ORAL | Status: DC | PRN
Start: 2018-02-16 — End: 2018-02-18

## 2018-02-16 MED ORDER — ROPIVACAINE 2 MG/ML INJECTION
2 mg/mL (0. %) | INTRAMUSCULAR | Status: DC | PRN
Start: 2018-02-16 — End: 2018-02-16
  Administered 2018-02-16: 13:00:00 via EPIDURAL

## 2018-02-16 MED FILL — MINERAL OIL ORAL: ORAL | Qty: 120

## 2018-02-16 MED FILL — BUTORPHANOL TARTRATE 2 MG/ML IJ SOLN: 2 mg/mL | INTRAMUSCULAR | Qty: 1

## 2018-02-16 MED FILL — DEXTROSE 5%-LACTATED RINGERS IV: INTRAVENOUS | Qty: 1000

## 2018-02-16 MED FILL — OXYCODONE-ACETAMINOPHEN 5 MG-325 MG TAB: 5-325 mg | ORAL | Qty: 1

## 2018-02-16 MED FILL — NAROPIN (PF) 2 MG/ML (0.2 %) INJECTION SOLUTION: 2 mg/mL (0. %) | INTRAMUSCULAR | Qty: 33.67

## 2018-02-16 MED FILL — PFIZERPEN-G 5 MILLION UNIT SOLUTION FOR INJECTION: 5 million unit | INTRAMUSCULAR | Qty: 5000000

## 2018-02-16 MED FILL — PENICILLIN G POT 2.5 MILLION UNITS IN 50 ML 0.9% NACL: 2.5 MU | INTRAVENOUS | Qty: 50

## 2018-02-16 MED FILL — NAROPIN (PF) 5 MG/ML (0.5 %) INJECTION SOLUTION: 5 mg/mL (0. %) | INTRAMUSCULAR | Qty: 13

## 2018-02-16 MED FILL — IBUPROFEN 800 MG TAB: 800 mg | ORAL | Qty: 1

## 2018-02-16 NOTE — Progress Notes (Signed)
Shift assessment complete as noted. Patient resting comfortably. Questions encouraged and answered. Encouraged to call for needs or concerns. Verbalizes understanding.

## 2018-02-16 NOTE — Progress Notes (Signed)
Pt resting. Denies needs.

## 2018-02-16 NOTE — Progress Notes (Signed)
Bedside report from Megan Hall, RN and care of pt assumed.

## 2018-02-16 NOTE — Progress Notes (Signed)
Dr. Leron Croakach updated on 0600 SVE 4.5/90/-1. Orders from MD to start pitocin. Orders read back to MD and confirmed to start pitocin.

## 2018-02-16 NOTE — Progress Notes (Signed)
SBAR report given to Ivar BuryAnn Whaley, RN. Care of patient relinquished.

## 2018-02-16 NOTE — Progress Notes (Signed)
Pt comfortable. Repositioned to the right side now.

## 2018-02-16 NOTE — Progress Notes (Signed)
[id number]- test dose, pt tolerated well

## 2018-02-16 NOTE — Progress Notes (Signed)
Pt resting. Denies needs at this time.

## 2018-02-16 NOTE — Progress Notes (Signed)
Bedside report received from Meagan Eby, RN. Patient care assumed.

## 2018-02-16 NOTE — Progress Notes (Signed)
Set up for delivery

## 2018-02-16 NOTE — Progress Notes (Signed)
Assisted pt up to restroom. Pt voided 600 mls without difficulty. Taught pt pericare using peribottle. Pt returned demonstrated teaching. Pt ambulated back to bed unassisted. Pt denies needs.

## 2018-02-16 NOTE — Progress Notes (Signed)
Pt presents to [facility] complaining of labor pains that began a few hrs previously. Denies SROM and vaginal bleeding but reports good FM. SVE during office visit closed per pt. Dr. Jerilee HohSpearman notified of pt's arrival and will be by shortly to evaluate.

## 2018-02-16 NOTE — Lactation Note (Signed)
This note was copied from a baby's chart.Lactation visit with first time mom.  Mom states infant breast fed well one time since birth, infant currently in warmer after bath.  Reviewed expectations of first 24 hours, feeding on demand based on hunger cues at least 8-10x per day, wet and dirty requirements and position review.  During teaching infant spit up a good amount of amniotic fluid.  Demonstrated to mom burping infant and encouraged some skin to skin.  Lactation to follow up tomorrow.

## 2018-02-16 NOTE — Other (Signed)
SBAR IN Report: Mother    Verbal report received from Ann Whaley RN on this patient, who is now being transferred from L&D (unit) for routine progression of care.  The patient is not wearing a green "Anesthesia-Duramorph" band.    Report consisted of patient's Situation, Background, Assessment and Recommendations (SBAR).       Newborn ID bands were compared with the identification form, and verified with the patient and transferring nurse.    Information from the SBAR and the [facility] Report was reviewed with the transferring nurse; opportunity for questions and clarification provided.

## 2018-02-16 NOTE — Progress Notes (Signed)
Dr Langley GaussLynagh called for an epidural

## 2018-02-16 NOTE — Lactation Note (Signed)
This note was copied from a baby's chart.First visit with breastfeeding mom.  Discussed feeding baby on cue.  Opening mouth, turning head from side to side, bringing hands to mouth and sucking movements are all early hunger cues.  Crying is a late hunger cue.  Do lots of skin to skin to help recognize early feeding cues.  If baby does not nurse, continue skin to skin and offer again later.  On average newborns eat at least 8 or more times per day without restriction. Cluster feeding is normal.  Reviewed positioning techniques and signs of a good latch.  Discussed holding baby so that ear, shoulder and hip are in a straight line.  Baby is held close and nose lines up with mom's nipple.  Discussed supporting baby's neck and mom's breast.  When baby opens wide bring baby quickly onto the breast.  Try for an assymetrical latch with nipple pointed towards the roof of baby's mouth.  Mouth should be wide and lips flanged around the breast.  Encouraged to nurse on the first breast until baby finishes that side.  If baby comes off the breast quickly, you can re-offer that same side to ensure good stimulation before moving baby to next side.  Offer the second breast, baby can take the second breast as long as desired.  It is ok if they do not take the second side when offered.  Listen and look for swallows.  Once milk is in over the next few days, swallowing will become more frequent and obvious.  If baby pausing on the breast longer than 10 seconds, remind baby to nurse.  Attempt to burp between breasts and after feeding.  Rotate which breast the baby starts on.  Discussed expected output based on age.  Discussed feed on demand.  If necessary rouse for feedings at least until above birth weight.  Reviewed Breastfeeding Packet and encouraged mom to write down feedings and output at least until first Ped visit.  In accordance with the 2012 AAP Policy Statement on  Breastfeeding, Mothers of healthy term breastfed infants should be delay pacifier use until breastfeeding is well-established, usually about 3 to 4 wk after birth.  Pacifiers are not available on the Mother Infant Unit.  Artificial nipples should also be avoided until breastfeeding is well established.

## 2018-02-16 NOTE — Progress Notes (Signed)
Pt states she is ready for her epidural. lr bolus started. Pitocin increased to 6 mu/min.

## 2018-02-16 NOTE — Progress Notes (Signed)
Shift assessment complete. See flowsheet for details. POC reviewed with pt and pt verbalized understanding. Pt oriented to room and has call light within reach. Pt denies any needs at this time but will call out PRN. Pt now resting in bed with friend at bedside.

## 2018-02-16 NOTE — H&P (Signed)
History & Physical    Name: Jessica Davies MRN: [id number]  SSN: [id number]    Date of Birth: 11-09-1991  Age: 27 y.o.  Sex: female      Chief c/o: painful contractions  Subjective:     Estimated Date of Delivery: 02/27/18  OB History   Gravida Para Term Preterm AB Living   1 0 0 0 0 0   SAB TAB Ectopic Molar Multiple Live Births   0 0 0 0 0 0      # Outcome Date GA Lbr Len/2nd Weight Sex Delivery Anes PTL Lv   1 Current                   Jessica Davies is admitted with pregnancy at [redacted]w[redacted]d for active labor. Prenatal course was normal except for anemia, chlamydia (treated), and thickened nucal fold- normal nipt. Please see prenatal records for details.    Past Medical History:   Diagnosis Date   ??? Anemia     with pregnancy    ??? Chlamydia 09/04/2017     Past Surgical History:   Procedure Laterality Date   ??? PR EXTRAC ERUPTED TOOTH/EXPOSED ROOT       Social History     Occupational History   ??? Not on file   Tobacco Use   ??? Smoking status: Never Smoker   ??? Smokeless tobacco: Never Used   Substance and Sexual Activity   ??? Alcohol use: No     Frequency: Never   ??? Drug use: No   ??? Sexual activity: Yes     Partners: Male     Family History   Problem Relation Age of Onset   ??? MS Mother    ??? Cancer Father    ??? Cancer Maternal Grandfather    ??? Diabetes Sister         pancreas/kidney transplant       No Known Allergies  Prior to Admission medications    Medication Sig Start Date End Date Taking? Authorizing Provider   iron-folate no1-C-B12-zinc-dss (FERIVA 21-7 TABLET) 75 mg iron- 1 mg-175 mg tab Take 1 Tab by mouth daily. 12/07/17  Yes Alt, Brandi K, DO   prenatal24-iron chel-folic-dha (PRENATAL DHA+COMPLETE PRENATAL) 30-975-300 mg-mcg-mg cmpk Take  by mouth.   Yes Provider, Historical        Review of Systems: A comprehensive review of systems was negative except for that written in the HPI.    Objective:     Vitals:  Vitals:    02/16/18 0000 02/16/18 0006   BP:  132/77   Pulse:  76   Weight: 74.4 kg (164 lb)     Height: 5\' 8"  (1.727 m)         Physical Exam:  Patient without distress.  Heart: Regular rate and rhythm  Lung: clear to auscultation throughout lung fields, no wheezes, no rales, no rhonchi and normal respiratory effort  Back: costovertebral angle tenderness absent  Abdomen: soft, nontender  Fundus: soft and non tender  Perineum: blood absent, amniotic fluid absent  Cervical Exam: 4 cm dilated    100% effaced    -2 station    Presenting Part: cephalic  Lower Extremities:  - Edema No   - Patellar Reflexes: 2+ bilaterally  Membranes:  Intact  Fetal Heart Rate: Reactive    Prenatal Labs:   Lab Results   Component Value Date/Time    Rubella, External immune 08/23/2017    GrBStrep, External positive 02/01/2018  HBsAg, External negative 08/23/2017    HIV, External NR 08/23/2017    RPR, External NR 08/23/2017    Gonorrhea, External negative 08/30/2017    Chlamydia, External positive 08/30/2017    ABO,Rh B positive 08/23/2017         Assessment/Plan:     Active Problems:    * No active hospital problems. *       Plan: 27 yo G1 at 8539w3d.Admit for Labor  Progressing normally.  Group B Strep was positive, will treat prophylactically with penicillin. Reactive nst    Signed By:  Lindaann SloughMaridee Jean Ame Heagle, MD     February 16, 2018

## 2018-02-16 NOTE — Progress Notes (Signed)
Orders to allow pt to ambulate for 1 hr then re-check cervix and call Dr. Jerilee HohSpearman with update. Pt instructed to return to [facility] if SROM.

## 2018-02-16 NOTE — Progress Notes (Signed)
SBAR OUT Report: Mother    Verbal report given to Megan Eby, RN (full name & credentials) on this patient, who is now being transferred to MIU (unit) for routine progression of care.   The patient is not wearing a green "Anesthesia-Duramorph" band.    Report consisted of patient's Situation, Background, Assessment and Recommendations (SBAR).       Newborn ID bands were compared with the identification form, and verified with the patient and receiving nurse.    Information from the SBAR and the [facility] Report was reviewed with the receiving nurse; opportunity for questions and clarification provided.

## 2018-02-16 NOTE — Progress Notes (Signed)
Assessment complete.

## 2018-02-16 NOTE — Progress Notes (Signed)
Dr Midge MiniumLantz in the room  1140 Pt starting to push

## 2018-02-16 NOTE — Anesthesia Pre-Procedure Evaluation (Signed)
Anesthetic History   No history of anesthetic complications            Review of Systems / Medical History  Patient summary reviewed and pertinent labs reviewed    Pulmonary  Within defined limits                 Neuro/Psych   Within defined limits           Cardiovascular                  Exercise tolerance: >4 METS     GI/Hepatic/Renal  Within defined limits              Endo/Other  Within defined limits           Other Findings   Comments: 4 cm           Physical Exam    Airway  Mallampati: I  TM Distance: 4 - 6 cm  Neck ROM: normal range of motion   Mouth opening: Normal     Cardiovascular    Rhythm: regular  Rate: normal         Dental  No notable dental hx       Pulmonary  Breath sounds clear to auscultation               Abdominal  GI exam deferred       Other Findings            Anesthetic Plan    ASA: 2  Anesthesia type: epidural          Induction: Intravenous  Anesthetic plan and risks discussed with: Patient

## 2018-02-16 NOTE — Anesthesia Procedure Notes (Signed)
Epidural Block    Start time: 02/16/2018 8:16 AM  End time: 02/16/2018 8:24 AM  Performed by: Ruslan Mccabe Susan, MD  Authorized by: Larnie Heart Susan, MD     Pre-Procedure  Indication: at surgeon's request and labor epidural    Timeout Time: 08:16        Epidural:   Patient position:  Seated  Prep region:  Lumbar  Prep: Chlorhexidine    Location:  L3-4    Needle and Epidural Catheter:   Needle Type:  Tuohy  Needle Gauge:  17 G  Injection Technique:  Loss of resistance using saline  Catheter Size:  19 G  Depth in Epidural Space (cm):  5  Events: no blood with aspiration, no cerebrospinal fluid with aspiration, no paresthesia and negative aspiration test    Test Dose:  Negative    Assessment:   Catheter Secured:  Tegaderm and tape  Insertion:  Uncomplicated  Patient tolerance:  Patient tolerated the procedure well with no immediate complications

## 2018-02-16 NOTE — Progress Notes (Signed)
Vaginal delivery per Dr Midge MiniumLantz.  Apgars 8/9

## 2018-02-16 NOTE — Anesthesia Post-Procedure Evaluation (Signed)
*   No procedures listed *.    Anesthesia Post Evaluation      Multimodal analgesia: multimodal analgesia used between 6 hours prior to anesthesia start to PACU discharge  Patient location during evaluation: bedside  Patient participation: complete - patient participated  Level of consciousness: awake  Pain score: 0  Pain management: adequate  Airway patency: patent  Anesthetic complications: no  Cardiovascular status: acceptable and stable  Respiratory status: acceptable and nasal cannula  Hydration status: acceptable  Comments:   Block resolving.        Visit Vitals  BP 134/68   Pulse 98   Temp 37.1 ??C (98.8 ??F)   Resp 18   Ht 5\' 8"  (1.727 m)   Wt 74.4 kg (164 lb)   Breastfeeding? Yes   BMI 24.94 kg/m??

## 2018-02-16 NOTE — Progress Notes (Signed)
Pt moved to 434 for labor. Pt oriented to room and call bell system. Report given to Elvera BickerMegan Hall, RN.

## 2018-02-16 NOTE — Progress Notes (Signed)
Pt attempted to ambulate  To restroom and felt dizzy.  Pt assisted  back to bed and I&O cath performed for 400cc. Pt then transferred to wheelchair to be transferred to MIU.

## 2018-02-16 NOTE — Progress Notes (Signed)
[id number]- dr Langley Gausslynagh at bs  Pt sitting up, lr bolused of 600 mls    0818- timeout

## 2018-02-16 NOTE — Progress Notes (Signed)
Pt repositioned to the right side.

## 2018-02-16 NOTE — Anesthesia Procedure Notes (Signed)
Epidural Block    Start time: 02/16/2018 8:16 AM  End time: 02/16/2018 8:24 AM  Performed by: Thomes LollingLynagh, Ixel Boehning Susan, MD  Authorized by: Thomes LollingLynagh, Syniyah Bourne Susan, MD     Pre-Procedure  Indication: at surgeon's request and labor epidural    Timeout Time: 08:16        Epidural:   Patient position:  Seated  Prep region:  Lumbar  Prep: Chlorhexidine    Location:  L3-4    Needle and Epidural Catheter:   Needle Type:  Tuohy  Needle Gauge:  17 G  Injection Technique:  Loss of resistance using saline  Catheter Size:  19 G  Depth in Epidural Space (cm):  5  Events: no blood with aspiration, no cerebrospinal fluid with aspiration, no paresthesia and negative aspiration test    Test Dose:  Negative    Assessment:   Catheter Secured:  Tegaderm and tape  Insertion:  Uncomplicated  Patient tolerance:  Patient tolerated the procedure well with no immediate complications

## 2018-02-17 MED FILL — IBUPROFEN 800 MG TAB: 800 mg | ORAL | Qty: 1

## 2018-02-17 NOTE — Progress Notes (Signed)
Assessment completed

## 2018-02-17 NOTE — Progress Notes (Signed)
Shift assessment complete as noted. Patient denies needs. Questions encouraged and answered. Encouraged to call for needs or concerns. Verbalizes understanding.

## 2018-02-17 NOTE — Progress Notes (Signed)
02/17/18 2327   Pain Assessment   Pain Scale 1 Numeric (0 - 10)   Pain Intensity 1 5   Pain Location 1 Perineum   Pain Description 1 Aching;Cramping;Sore   Pain Intervention(s) 1 Medication (see MAR)     PRN Percocet for pain

## 2018-02-17 NOTE — Progress Notes (Signed)
Bedside report received from Kellie Moore, RN. Patient care assumed.

## 2018-02-17 NOTE — Progress Notes (Signed)
CM met with patient after reviewing the chart. Patient advises that she has a car seat, place for baby to sleep, and a pediatrician selected. Patient states that she's already established with primary care and does not require a referral at this time. Patient is undecided on [place]. No needs identified, packet on postpartum depression provided with instructions to contact [facility] with any questions or concerns following discharge.    Henrene Dodge, [place]  Social Work Case Chiropractor. [place]  [phone]

## 2018-02-17 NOTE — Progress Notes (Signed)
02/17/18 1939   Pain Assessment   Pain Scale 1 Numeric (0 - 10)   Pain Intensity 1 6   Pain Location 1 Abdomen   Pain Description 1 Aching;Cramping   Pain Intervention(s) 1 Medication (see MAR)     PRN Motrin 800 mg for pain

## 2018-02-17 NOTE — Lactation Note (Signed)
This note was copied from a baby's chart.Assisted with breastfeeding in cross cradle on L.  Baby fed fairly well.  Latched easily.  Demonstrated manual lip flange.  Encouraged frequent feeding and watch output.  Noted baby had not had a wet diaper since delivery, but he did have one about 1 hour after this feeding.  Discussed insurance pumping if no wet.

## 2018-02-17 NOTE — Progress Notes (Signed)
Bedside report received from Julie Thomas RN. Pt care assumed.

## 2018-02-18 MED ORDER — OXYCODONE-ACETAMINOPHEN 5 MG-325 MG TAB
5-325 mg | ORAL_TABLET | ORAL | 0 refills | Status: AC | PRN
Start: 2018-02-18 — End: ?

## 2018-02-18 MED ORDER — IBUPROFEN 800 MG TAB
800 mg | ORAL_TABLET | Freq: Four times a day (QID) | ORAL | 0 refills | Status: AC | PRN
Start: 2018-02-18 — End: ?

## 2018-02-18 MED ORDER — DIPHTH,PERTUS(AC)TETANUS VAC(PF) 2.5 LF UNIT-8 MCG-5 LF/0.5 ML INJ
INTRAMUSCULAR | Status: AC
Start: 2018-02-18 — End: 2018-02-18
  Administered 2018-02-18: 16:00:00 via INTRAMUSCULAR

## 2018-02-18 MED FILL — IBUPROFEN 800 MG TAB: 800 mg | ORAL | Qty: 1

## 2018-02-18 MED FILL — OXYCODONE-ACETAMINOPHEN 5 MG-325 MG TAB: 5-325 mg | ORAL | Qty: 1

## 2018-02-18 MED FILL — BOOSTRIX TDAP 2.5 LF UNIT-8 MCG-5 LF/0.5 ML INTRAMUSCULAR SUSPENSION: INTRAMUSCULAR | Qty: 0.5

## 2018-02-18 NOTE — Progress Notes (Signed)
Pt ready for scheduled discharge to home. HUGS tag removed. Escorted off unit by MIU staff with infant in rear facing car seat to private vehicle.

## 2018-02-18 NOTE — Progress Notes (Signed)
02/18/18 0401   Pain Assessment   Pain Scale 1 Numeric (0 - 10)   Pain Intensity 1 4   Pain Location 1 Abdomen;Perineum   Pain Description 1 Aching;Cramping   Pain Intervention(s) 1 Medication (see MAR)     PRN Motrin 800 mg and Percocet 5 mg for pain

## 2018-02-18 NOTE — Progress Notes (Signed)
Assessment completed

## 2018-02-18 NOTE — Discharge Summary (Signed)
Obstetrical Discharge Summary     Name: Jessica Davies MRN: [id number]  SSN: [id number]    Date of Birth: 07/14/1991  Age: 27 y.o.  Sex: female      Allergies: Patient has no known allergies.    Admit Date: 02/15/2018    Discharge Date: 02/18/2018     Admitting Physician: Lindaann SloughMaridee Jean Spearman, MD     Attending Physician:  Rayne DuBerlet-Dach, Stephanie, *     * Admission Diagnoses: Normal labor [O80, Z37.9]    * Discharge Diagnoses:   Information for the patient's newborn:  Santiago BurMarshall, BOY Sheera [[id number]][[id number]]   Delivery of a 6 lb 11.8 oz (3.055 kg) female infant via Vaginal, Spontaneous on 02/16/2018 at 11:45 AM  by . Apgars were 8 and 9.       Additional Diagnoses:   Hospital Problems as of 02/18/2018 Date Reviewed: 02/09/2018          Codes Class Noted - Resolved POA    * (Principal) Normal labor ICD-10-CM: O80, Z37.9  ICD-9-CM: 650  02/16/2018 - Present Unknown             Lab Results   Component Value Date/Time    ABO/Rh(D) B POSITIVE 02/16/2018 02:20 AM    Rubella, External immune 08/23/2017    GrBStrep, External positive 02/01/2018    ABO,Rh B positive 08/23/2017      Immunization History   Administered Date(s) Administered   ??? Influenza Vaccine (Quad) PF 09/28/2017   ??? TB Skin Test (PPD) Intradermal 07/11/2017       * Procedures: vaginal delivery  * No surgery found *           * Discharge Condition: good    * Hospital Course: Normal hospital course following the delivery.    * Disposition: Home    Discharge Medications:   Current Discharge Medication List      START taking these medications    Details   ibuprofen (MOTRIN) 800 mg tablet Take 1 Tab by mouth every six (6) hours as needed.  Qty: 40 Tab, Refills: 0      oxyCODONE-acetaminophen (PERCOCET) 5-325 mg per tablet Take 1 Tab by mouth every four (4) hours as needed. Max Daily Amount: 6 Tabs.  Qty: 15 Tab, Refills: 0    Associated Diagnoses: Normal labor         CONTINUE these medications which have NOT CHANGED    Details    iron-folate no1-C-B12-zinc-dss (FERIVA 21-7 TABLET) 75 mg iron- 1 mg-175 mg tab Take 1 Tab by mouth daily.  Qty: 30 Tab, Refills: 6      prenatal24-iron chel-folic-dha (PRENATAL DHA+COMPLETE PRENATAL) 30-975-300 mg-mcg-mg cmpk Take  by mouth.             * Follow-up Care/Patient Instructions:  Activity: No sex for 6 weeks, No driving while on analgesics and No heavy lifting for 4 weeks  Diet: Regular Diet  Wound Care: Keep wound clean and dry    Follow-up Information     Follow up With Specialties Details Why Contact Info    Janean SarkEberly, John B, MD [facility]   [address]  [place] [id number]  [phone]             Signed By:  Benedict Needyodd R Daylan Juhnke, MD     February 18, 2018

## 2018-02-18 NOTE — Progress Notes (Signed)
Teaching for self care reviewed. Discharge instructions reviewed and E-signed. Copy given to pt. Prescription given with information. Reviewed follow up appointment for self. Questions encouraged and answered.  Identification verified with mom and infant bands and signed. Instructed to call when ready for discharge.

## 2018-02-18 NOTE — Progress Notes (Signed)
Bedside report received from Lauren Bowyer RN. Pt care assumed.

## 2018-02-18 NOTE — Lactation Note (Signed)
This note was copied from a baby's chart.Lactation visit. In to check on feedings. Mom states baby has been latching well. Nipples are sore but intact. Reviewed sore nipple protocol, gave lanolin. Mom states baby needs to feed now. Encouraged mom to unwrap baby from blanket to rouse him to be more alert. Baby woke easily. Mom latched baby independently onto right breast in cross cradle hold. Baby latched well. Good latch observed and reviewed signs of good latch with mother. Baby clicks intermittently but is audibly swallowing as well. Good feeds and output in past 24 hours. Showed mom breast massage. Doing well. Continue to feed on demand. Watch output. Questions answered.

## 2018-02-18 NOTE — Lactation Note (Signed)
This note was copied from a baby's chart.Mom and baby are going home today.  Continue to offer the breast without restriction.  Mom's milk should be fully in over the next few days.  Reviewed engorgement precautions.  Hand Expression has been demoed and written hand-out reviewed.  As milk comes in baby will be more alert at the breast and swallows will be more obvious.  Breasts may feel softer once baby has finished nursing.  Baby should be back to birth weight by 2 weeks of age.  And then gain on average 1 oz per day for the next 2-3 months.  Reviewed babies should be exclusively breastfeeding for the first 6 months and that breastfeeding should continue after introduction of appropriate complimentary foods after 6 months.  Initial output should be at least 1 wet and 1 bowel movement for each day old baby is.  By day 5-7 once milk is fully in baby will consistently have 6 or more soaking wet diapers and about 4 bowel movement.  Some babies have a bowel movement with every feeding and some have 1-3 large bowel movements each day.  Inadequate output may indicate inadequate feedings and should be reported to your Pediatrician.  Bowel habits may change as baby gets older.  Encouraged follow-up at Pediatrician in 1-2 days, no later than 1 week of age.  Call [facility] Lactation Center for any questions as needed or to set up an OP visit.  OP phone calls are returned within 24 hours. Community Breastfeeding Resource List given.

## 2018-02-18 NOTE — Progress Notes (Signed)
Post-Partum Day Number 2 Progress/Discharge Note    Patient doing well post-partum without significant complaint.  Voiding without difficulty, normal lochia, positive flatus.    Vitals:  No data found.  Temp (24hrs), Avg:98.2 ??F (36.8 ??C), Min:98.1 ??F (36.7 ??C), Max:98.3 ??F (36.8 ??C)      Vital signs stable, afebrile.    Exam:  Patient without distress.               Abdomen soft, fundus firm at level of umbilicus, non tender               Lower extremities are negative for swelling, cords or tenderness.    Lab/Data Review:  All lab results for the last 24 hours reviewed.    Assessment and Plan:  Patient appears to be having uncomplicated post-partum course.  Continue routine perineal care and maternal education.  Plan discharge for today with follow up in our office in 2 weeks.

## 2018-02-18 NOTE — Discharge Summary (Signed)
Obstetrical Discharge Summary     Name: Jessica OresMellarie O Fadness MRN: [id number]  SSN: [id number]    Date of Birth: 08/18/1991  Age: 27 y.o.  Sex: female      Allergies: Patient has no known allergies.    Admit Date: 02/15/2018    Discharge Date: 02/18/2018     Admitting Physician: Lindaann SloughMaridee Jean Spearman, MD     Attending Physician:  Rayne DuBerlet-Dach, Stephanie, *     * Admission Diagnoses: Normal labor [O80, Z37.9]    * Discharge Diagnoses:   Information for the patient's newborn:  Santiago BurMarshall, BOY Bryar [[id number]][[id number]]   Delivery of a 6 lb 11.8 oz (3.055 kg) female infant via Vaginal, Spontaneous on 02/16/2018 at 11:45 AM  by . Apgars were 8 and 9.       Additional Diagnoses:   Hospital Problems as of 02/18/2018 Date Reviewed: 02/09/2018          Codes Class Noted - Resolved POA    * (Principal) Normal labor ICD-10-CM: O80, Z37.9  ICD-9-CM: 650  02/16/2018 - Present Unknown             Lab Results   Component Value Date/Time    ABO/Rh(D) B POSITIVE 02/16/2018 02:20 AM    Rubella, External immune 08/23/2017    GrBStrep, External positive 02/01/2018    ABO,Rh B positive 08/23/2017      Immunization History   Administered Date(s) Administered   ??? Influenza Vaccine (Quad) PF 09/28/2017   ??? TB Skin Test (PPD) Intradermal 07/11/2017       * Procedures: vaginal delivery  * No surgery found *           * Discharge Condition: good    * Hospital Course: Normal hospital course following the delivery.    * Disposition: Home    Discharge Medications:   Current Discharge Medication List      START taking these medications    Details   ibuprofen (MOTRIN) 800 mg tablet Take 1 Tab by mouth every six (6) hours as needed.  Qty: 40 Tab, Refills: 0      oxyCODONE-acetaminophen (PERCOCET) 5-325 mg per tablet Take 1 Tab by mouth every four (4) hours as needed. Max Daily Amount: 6 Tabs.  Qty: 15 Tab, Refills: 0    Associated Diagnoses: Normal labor         CONTINUE these medications which have NOT CHANGED    Details   iron-folate no1-C-B12-zinc-dss (FERIVA 21-7  TABLET) 75 mg iron- 1 mg-175 mg tab Take 1 Tab by mouth daily.  Qty: 30 Tab, Refills: 6      prenatal24-iron chel-folic-dha (PRENATAL DHA+COMPLETE PRENATAL) 30-975-300 mg-mcg-mg cmpk Take  by mouth.             * Follow-up Care/Patient Instructions:  Activity: No sex for 6 weeks, No driving while on analgesics and No heavy lifting for 4 weeks  Diet: Regular Diet  Wound Care: Keep wound clean and dry    Follow-up Information     Follow up With Specialties Details Why Contact Info    Janean SarkEberly, John B, MD [facility]   [address]  [place] [id number]  [phone]             Signed By:  Benedict Needyodd R Sulo Janczak, MD     February 18, 2018

## 2018-02-21 NOTE — Telephone Encounter (Signed)
Lactation follow up call. Mom doing well. Milk in. Baby latching and feeding well at the breast. Copious output. Cluster feeding some each day, reassured mom of normalcy. Missed first ped visit for transportation issues, has appt rescheduled for Friday for his first visit. Mom denies needs or questions at this time. Encouraged frequent feeds, watch output. Doing well per mom report.

## 2018-08-07 ENCOUNTER — Encounter: Payer: PRIVATE HEALTH INSURANCE | Attending: Pediatric Hematology-Oncology | Primary: Family Medicine

## 2018-08-07 ENCOUNTER — Encounter: Primary: Family Medicine

## 2018-08-16 ENCOUNTER — Encounter (HOSPITAL_COMMUNITY): Payer: Self-pay | Admitting: Emergency Medicine

## 2018-08-16 ENCOUNTER — Emergency Department (HOSPITAL_COMMUNITY)
Admission: EM | Admit: 2018-08-16 | Discharge: 2018-08-17 | Disposition: A | Discharge status: Home / Self Care | Payer: Self-pay | Attending: Emergency Medicine | Admitting: Emergency Medicine

## 2018-08-16 ENCOUNTER — Emergency Department (HOSPITAL_COMMUNITY): Payer: Self-pay

## 2018-08-16 ENCOUNTER — Other Ambulatory Visit: Payer: Self-pay

## 2018-08-16 DIAGNOSIS — R079 Chest pain, unspecified: Secondary | ICD-10-CM

## 2018-08-16 DIAGNOSIS — R0789 Other chest pain: Secondary | ICD-10-CM | POA: Insufficient documentation

## 2018-08-16 DIAGNOSIS — D509 Iron deficiency anemia, unspecified: Secondary | ICD-10-CM | POA: Insufficient documentation

## 2018-08-16 LAB — CBC
HEMATOCRIT: 33.3 % — AB (ref 36.0–46.0)
Hemoglobin: 10.4 g/dL — ABNORMAL LOW (ref 12.0–15.0)
MCH: 22.6 pg — ABNORMAL LOW (ref 26.0–34.0)
MCHC: 31.2 g/dL (ref 30.0–36.0)
MCV: 72.4 fL — AB (ref 78.0–100.0)
Platelets: 309 10*3/uL (ref 150–400)
RBC: 4.6 MIL/uL (ref 3.87–5.11)
RDW: 14 % (ref 11.5–15.5)
WBC: 6.8 10*3/uL (ref 4.0–10.5)

## 2018-08-16 LAB — BASIC METABOLIC PANEL
ANION GAP: 7 (ref 5–15)
BUN: 12 mg/dL (ref 6–20)
CHLORIDE: 107 mmol/L (ref 98–111)
CO2: 25 mmol/L (ref 22–32)
Calcium: 9.5 mg/dL (ref 8.9–10.3)
Creatinine, Ser: 0.82 mg/dL (ref 0.44–1.00)
Glucose, Bld: 90 mg/dL (ref 70–99)
Potassium: 3.9 mmol/L (ref 3.5–5.1)
SODIUM: 139 mmol/L (ref 135–145)

## 2018-08-16 LAB — I-STAT BETA HCG BLOOD, ED (MC, WL, AP ONLY)

## 2018-08-16 LAB — I-STAT TROPONIN, ED: Troponin i, poc: 0 ng/mL (ref 0.00–0.08)

## 2018-08-16 MED ORDER — ACETAMINOPHEN 500 MG PO TABS
1000.0000 mg | ORAL_TABLET | Freq: Once | ORAL | Status: AC
  Administered 2018-08-17: 1000 mg via ORAL
  Filled 2018-08-16: qty 2

## 2018-08-16 NOTE — ED Triage Notes (Signed)
Pt states she began having right sided chest pain yesterday when taking a deep breath. Describes it as stabbing in nature. No SHOB or other symptoms.

## 2018-08-17 ENCOUNTER — Encounter (HOSPITAL_COMMUNITY): Payer: Self-pay | Admitting: Student

## 2018-08-17 ENCOUNTER — Emergency Department (HOSPITAL_COMMUNITY): Payer: Self-pay

## 2018-08-17 LAB — D-DIMER, QUANTITATIVE: D-Dimer, Quant: 0.51 ug/mL-FEU — ABNORMAL HIGH (ref 0.00–0.50)

## 2018-08-17 MED ORDER — IOPAMIDOL (ISOVUE-370) INJECTION 76%
INTRAVENOUS | Status: AC
  Filled 2018-08-17: qty 100

## 2018-08-17 MED ORDER — IOPAMIDOL (ISOVUE-370) INJECTION 76%
100.0000 mL | Freq: Once | INTRAVENOUS | Status: AC | PRN
  Administered 2018-08-17: 100 mL via INTRAVENOUS

## 2018-08-17 NOTE — ED Provider Notes (Signed)
[facility] EMERGENCY DEPARTMENT Provider Note   CSN: [id number] Arrival date & time: 08/16/18  2219     History   Chief Complaint Chief Complaint  Patient presents with  . Chest Pain    HPI Barbara Conley is a 27 y.o. female.  HPI   Patient is a 27 year old female with no significant past medical history presenting for right-sided pleuritic chest pain.  Patient reports it began suddenly yesterday while she was resting.  Patient denies any shortness of breath but the pain, but reports is been persistent, nonradiating and unchanging throughout the day.  Patient denies any trauma to the right side of her rib cage.  Patient denies any history of similar symptoms.  Patient denies any history of DVT/PE, estrogen use, recent immobilization, hospitalization, recent surgery, cough or hemoptysis, lower extremity edema or calf tenderness.  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  Patient also noted she had an upper respiratory infection within the past couple weeks.  History reviewed. No pertinent past medical history.  There are no active problems to display for this patient.   History reviewed. No pertinent surgical history.   OB History   None      Home Medications    Prior to Admission medications   Not on File    Family History No family history on file.  Social History Social History   Tobacco Use  . Smoking status: Never Smoker  . Smokeless tobacco: Never Used  Substance Use Topics  . Alcohol use: No  . Drug use: No     Allergies   Patient has no known allergies.   Review of Systems Review of Systems  Constitutional: Negative for chills and fever.  HENT: Negative for congestion, rhinorrhea, sinus pain and sore throat.   Eyes: Negative for visual disturbance.  Respiratory: Negative for cough, chest tightness and shortness of breath.   Cardiovascular: Positive for chest pain. Negative for palpitations and leg swelling.    Gastrointestinal: Negative for abdominal pain, nausea and vomiting.  Genitourinary: Negative for dysuria and flank pain.  Musculoskeletal: Negative for back pain and myalgias.  Skin: Negative for rash.  Neurological: Negative for dizziness, syncope, light-headedness and headaches.     Physical Exam Updated Vital Signs BP 134/74 (BP Location: Right Arm)   Pulse 78   Temp 98.7 F (37.1 C) (Oral)   Resp 16   SpO2 95%   Physical Exam  Constitutional: She appears well-developed and well-nourished. No distress.  HENT:  Head: Normocephalic and atraumatic.  Mouth/Throat: Oropharynx is clear and moist.  Eyes: Pupils are equal, round, and reactive to light. Conjunctivae and EOM are normal.  Neck: Normal range of motion. Neck supple.  Cardiovascular: Normal rate, regular rhythm, S1 normal and S2 normal.  No murmur heard. Pulses:      Radial pulses are 2+ on the right side, and 2+ on the left side.       Dorsalis pedis pulses are 2+ on the right side, and 2+ on the left side.  No lower extremity edema.  No calf tenderness.  Pulmonary/Chest: Effort normal and breath sounds normal. She has no wheezes. She has no rales.  Abdominal: Soft. She exhibits no distension. There is no tenderness. There is no guarding.  Musculoskeletal: Normal range of motion. She exhibits no edema or deformity.  Lymphadenopathy:    She has no cervical adenopathy.  Neurological: She is alert.  Cranial nerves grossly intact. Patient moves extremities symmetrically and with good coordination.  Skin: Skin is warm and dry. No rash noted. No erythema.  Psychiatric: She has a normal mood and affect. Her behavior is normal. Judgment and thought content normal.  Nursing note and vitals reviewed.    ED Treatments / Results  Labs (all labs ordered are listed, but only abnormal results are displayed) Labs Reviewed  CBC - Abnormal; Notable for the following components:      Result Value   Hemoglobin 10.4 (*)    HCT  33.3 (*)    MCV 72.4 (*)    MCH 22.6 (*)    All other components within normal limits  D-DIMER, QUANTITATIVE (NOT AT [facility]) - Abnormal; Notable for the following components:   D-Dimer, Quant 0.51 (*)    All other components within normal limits  BASIC METABOLIC PANEL  I-STAT TROPONIN, ED  I-STAT BETA HCG BLOOD, ED ([facility], [facility], [facility] ONLY)    EKG EKG Interpretation  Date/Time:  Thursday August 16 2018 22:25:09 EDT Ventricular Rate:  73 PR Interval:  140 QRS Duration: 84 QT Interval:  368 QTC Calculation: 405 R Axis:   84 Text Interpretation:  Normal sinus rhythm with sinus arrhythmia Normal ECG Confirmed by Kristine RoyalMessick, Peter [phone]([id number]) on 08/17/2018 12:54:30 AM   Radiology Dg Chest 2 View  Result Date: 08/16/2018 CLINICAL DATA:  27 y/o F; right-sided chest pain when taking a deep breath. EXAM: CHEST - 2 VIEW COMPARISON:  03/23/2017 chest radiograph FINDINGS: Stable heart size and mediastinal contours are within normal limits. Both lungs are clear. The visualized skeletal structures are unremarkable. IMPRESSION: No acute pulmonary process identified.  Stable chest radiograph. Electronically Signed   By: Mitzi HansenLance  Furusawa-Stratton M.D.   On: 08/16/2018 22:43    Procedures Procedures (including critical care time)  Medications Ordered in ED Medications  acetaminophen (TYLENOL) tablet 1,000 mg (1,000 mg Oral Given 08/17/18 0018)     Initial Impression / Assessment and Plan / ED Course  I have reviewed the triage vital signs and the nursing notes.  Pertinent labs & imaging results that were available during my care of the patient were reviewed by me and considered in my medical decision making (see chart for details).  Clinical Course as of Aug 18 123  Fri Aug 17, 2018  0050 Patient stated that she has a history of low BP.  Hemoglobin(!): 10.4 [AM]    Clinical Course User Index [AM] Elisha PonderMurray, Kendric Sindelar B, PA-C    Patient is nontoxic-appearing, afebrile, non-tachycardic and in no respiratory  distress.  Patient does have oxygen saturation 95%.  Differential diagnosis includes PE pneumonia, pneumothorax, pleurisy.  Patient has no major risk factors for pulmonary embolism, but patient did deliver a child 6 months ago, creating period of high estrogen state.   Troponin is negative.  EKG without acute ischemic changes, and no evidence of diffuse ST elevation nor PR depression suggestive of pericarditis.  Patient has hemoglobin of 10.4, and reports that she has heavy periods and previously was told that her hemoglobin was low.  Will recommend outpatient follow-up.  Chest x-ray without evidence of pneumothorax or other cardiopulmonary abnormality.  Patient has slightly elevated d-dimer at 0.51.  CTPA pending.  Anticipate patient has CTPA without evidence of pulmonary embolism that she can be safely discharged home.  [facility] PA-C at 1:25 AM to await ultimate disposition after CTPA.  Final Clinical Impressions(s) / ED Diagnoses   Final diagnoses:  Right-sided chest pain  Microcytic anemia    ED Discharge Orders  None       Delia Chimes 08/17/18 0129    Wynetta Fines, MD 08/17/18 [phone]

## 2018-08-17 NOTE — ED Notes (Signed)
Patient transported to CT 

## 2018-08-17 NOTE — ED Notes (Signed)
Signature pad not available. Discharge instructions reviewed and all questions answered. Pt verbalized understanding.  

## 2018-08-17 NOTE — ED Provider Notes (Signed)
01:00: Assumed care from [facility], PA-C at change of shift pending CTPA.   See prior provider note for full H&P.  Briefly patient is a 27 year old female without significant past medical history who presented for right-sided pleuritic type chest pain.  No remarkable associated symptoms.  Physical Exam  BP 134/74 (BP Location: Right Arm)   Pulse 78   Temp 98.7 F (37.1 C) (Oral)   Resp 16   SpO2 95%   Physical Exam  Constitutional: She appears well-developed and well-nourished. No distress.  HENT:  Head: Normocephalic and atraumatic.  Eyes: Conjunctivae are normal. Right eye exhibits no discharge. Left eye exhibits no discharge.  Cardiovascular: Normal rate, regular rhythm and intact distal pulses.  No murmur heard. Pulmonary/Chest: Effort normal and breath sounds normal. No respiratory distress.  Neurological: She is alert.  Clear speech.   Psychiatric: She has a normal mood and affect. Her behavior is normal. Thought content normal.  Nursing note and vitals reviewed.   ED Course/Procedures    Procedures   Results for orders placed or performed during the hospital encounter of 08/16/18  Basic metabolic panel  Result Value Ref Range   Sodium 139 135 - 145 mmol/L   Potassium 3.9 3.5 - 5.1 mmol/L   Chloride 107 98 - 111 mmol/L   CO2 25 22 - 32 mmol/L   Glucose, Bld 90 70 - 99 mg/dL   BUN 12 6 - 20 mg/dL   Creatinine, Ser 2.44 0.44 - 1.00 mg/dL   Calcium 9.5 8.9 - 01.0 mg/dL   GFR calc non Af Amer >60 >60 mL/min   GFR calc Af Amer >60 >60 mL/min   Anion gap 7 5 - 15  CBC  Result Value Ref Range   WBC 6.8 4.0 - 10.5 K/uL   RBC 4.60 3.87 - 5.11 MIL/uL   Hemoglobin 10.4 (L) 12.0 - 15.0 g/dL   HCT 27.2 (L) 53.6 - 64.4 %   MCV 72.4 (L) 78.0 - 100.0 fL   MCH 22.6 (L) 26.0 - 34.0 pg   MCHC 31.2 30.0 - 36.0 g/dL   RDW 03.4 74.2 - 59.5 %   Platelets 309 150 - 400 K/uL  D-dimer, quantitative (not at [facility])  Result Value Ref Range   D-Dimer, Quant 0.51 (H) 0.00 - 0.50  ug/mL-FEU  I-stat troponin, ED  Result Value Ref Range   Troponin i, poc 0.00 0.00 - 0.08 ng/mL   Comment 3          I-Stat beta hCG blood, ED  Result Value Ref Range   I-stat hCG, quantitative <5.0 <5 mIU/mL   Comment 3           Dg Chest 2 View  Result Date: 08/16/2018 CLINICAL DATA:  27 y/o F; right-sided chest pain when taking a deep breath. EXAM: CHEST - 2 VIEW COMPARISON:  03/23/2017 chest radiograph FINDINGS: Stable heart size and mediastinal contours are within normal limits. Both lungs are clear. The visualized skeletal structures are unremarkable. IMPRESSION: No acute pulmonary process identified.  Stable chest radiograph. Electronically Signed   By: Mitzi Hansen M.D.   On: 08/16/2018 22:43   Ct Angio Chest Pe W/cm &/or Wo Cm  Result Date: 08/17/2018 CLINICAL DATA:  Right-sided chest pain EXAM: CT ANGIOGRAPHY CHEST WITH CONTRAST TECHNIQUE: Multidetector CT imaging of the chest was performed using the standard protocol during bolus administration of intravenous contrast. Multiplanar CT image reconstructions and MIPs were obtained to evaluate the vascular anatomy. CONTRAST:  65 mL Isovue  370 intravenous COMPARISON:  Chest x-ray 08/16/2017 FINDINGS: Cardiovascular: Satisfactory opacification of the pulmonary arteries to the segmental level. No evidence of pulmonary embolism. Normal heart size. No pericardial effusion. Nonaneurysmal aorta. Mediastinum/Nodes: No enlarged mediastinal, hilar, or axillary lymph nodes. Thyroid gland, trachea, and esophagus demonstrate no significant findings. Lungs/Pleura: Lungs are clear. No pleural effusion or pneumothorax. Upper Abdomen: No acute abnormality. Musculoskeletal: No chest wall abnormality. No acute or significant osseous findings. Review of the MIP images confirms the above findings. IMPRESSION: No CT evidence for acute pulmonary embolus.  Clear lung fields. Electronically Signed   By: Jasmine PangKim  Fujinaga M.D.   On: 08/17/2018 02:31    MDM     Patient work-up reviewed and fairly unremarkable.  She does have anemia with a hemoglobin of 10.4 which will require PCP recheck.  Her EKG is normal, troponin negative, otherwise healthy female doubt ACS.Marland Kitchen.  Her chest x-ray is normal.  D-dimer positive prompting CT angio to evaluate for pulmonary embolism.  Patient CT is negative for pulmonary embolism, lungs are clear.  Per discussion with previous provider plan for discharge home with primary care follow-up, [facility]. I discussed results, treatment plan, need for PCP follow-up, and return precautions with the patient. Provided opportunity for questions, patient confirmed understanding and is in agreement with plan.       Cherly Andersonetrucelli,  R, PA-C 08/17/18 [id number]    Derwood KaplanNanavati, Ankit, MD 08/17/18 2312

## 2018-08-17 NOTE — Discharge Instructions (Addendum)
You were seen in the emergency department today for chest pain. Your work-up in the emergency department has been overall reassuring. Your labs have been fairly normal and or similar to previous blood work you have had done with the exception of your hemoglobin being low at 10.4, consistent with anemia, this is something that will need to be rechecked by a primary care provider.  Your EKG and the enzyme we use to check your heart did not show an acute heart attack at this time. Your chest x-ray was normal.  The CT of your chest did not show blood clots in your lungs or any other abnormalities.  We would like you to follow up closely with your primary care provider within the next 3 to 5 days, if you do not have a primary care provider we have provided information for our [place] clinic.  Return to the ER immediately should you experience any new or worsening symptoms including but not limited to return of pain, worsened pain, vomiting, shortness of breath, dizziness, lightheadedness, passing out, or any other concerns that you may have.

## 2019-08-15 ENCOUNTER — Other Ambulatory Visit: Payer: Self-pay

## 2019-08-15 ENCOUNTER — Encounter (HOSPITAL_COMMUNITY): Payer: Self-pay | Admitting: Emergency Medicine

## 2019-08-15 ENCOUNTER — Emergency Department (HOSPITAL_COMMUNITY)
Admission: EM | Admit: 2019-08-15 | Discharge: 2019-08-15 | Disposition: A | Discharge status: Home / Self Care | Payer: BLUE CROSS/BLUE SHIELD | Attending: Emergency Medicine | Admitting: Emergency Medicine

## 2019-08-15 DIAGNOSIS — R531 Weakness: Secondary | ICD-10-CM | POA: Diagnosis not present

## 2019-08-15 DIAGNOSIS — M545 Low back pain, unspecified: Secondary | ICD-10-CM

## 2019-08-15 NOTE — ED Triage Notes (Signed)
Per pt, states lower back pain for 3 days-no injury or trauma, no dysuria

## 2019-08-15 NOTE — Discharge Instructions (Signed)
Please read and follow all provided instructions.  Your diagnoses today include:  1. Acute bilateral low back pain without sciatica    Tests performed today include:  Vital signs - see below for your results today  Medications prescribed:  Please use over-the-counter NSAID medications (ibuprofen, naproxen) or Tylenol as directed on the packaging for pain.   Take any prescribed medications only as directed.  Home care instructions:   Follow any educational materials contained in this packet  Please rest, use ice or heat on your back for the next several days  Do not lift, push, pull anything more than 10 pounds for the next week  Follow-up instructions: Please follow-up with your primary care provider in the next 2 week for further evaluation of your symptoms if they are not getting better.   Return instructions:  SEEK IMMEDIATE MEDICAL ATTENTION IF YOU HAVE:  New numbness, tingling, weakness, or problem with the use of your arms or legs  Severe back pain not relieved with medications  Loss control of your bowels or bladder  Increasing pain in any areas of the body (such as chest or abdominal pain)  Shortness of breath, dizziness, or fainting.   Worsening nausea (feeling sick to your stomach), vomiting, fever, or sweats  Any other emergent concerns regarding your health   Additional Information:  Your vital signs today were: BP 112/74 (BP Location: Left Arm)    Pulse 81    Temp 99 F (37.2 C) (Oral)    Resp 16    LMP 07/27/2019    SpO2 100%  If your blood pressure (BP) was elevated above 135/85 this visit, please have this repeated by your doctor within one month. --------------

## 2019-08-15 NOTE — ED Provider Notes (Signed)
[facility]-EMERGENCY DEPT Provider Note   CSN: [id number] Arrival date & time: 08/15/19  [id number]     History   Chief Complaint Chief Complaint  Patient presents with  . Back Pain    HPI Barbara Conley is a 28 y.o. female.     Patient with no significant past medical history presents the emergency department with complaint of back pain ongoing over the past 3 days.  Symptoms started spontaneously and was not related to any injuries.  She describes pain across her lower back.  At times the pain radiates down into her bilateral legs.  She states that her legs feel weak at times however she has had no difficulty standing, walking and has not stumbled or fallen.  She has been taking Tylenol with improvement in her symptoms.  Symptoms are not worse during any particular time of day.  She denies overuse or stressful activities on her back.  She denies any abdominal pain, bowel changes, urinary symptoms, pelvic pain, vaginal bleeding or discharge.  Patient denies warning symptoms of back pain including: fecal incontinence, urinary retention or overflow incontinence, night sweats, waking from sleep with back pain, unexplained fevers or weight loss, h/o cancer, IVDU, recent trauma.        History reviewed. No pertinent past medical history.  There are no active problems to display for this patient.   History reviewed. No pertinent surgical history.   OB History   No obstetric history on file.      Home Medications    Prior to Admission medications   Not on File    Family History No family history on file.  Social History Social History   Tobacco Use  . Smoking status: Never Smoker  . Smokeless tobacco: Never Used  Substance Use Topics  . Alcohol use: No  . Drug use: No     Allergies   Patient has no known allergies.   Review of Systems Review of Systems  Constitutional: Negative for fever and unexpected weight change.  Gastrointestinal:  Negative for constipation.       Negative for fecal incontinence.   Genitourinary: Negative for dysuria, flank pain, hematuria, pelvic pain, vaginal bleeding and vaginal discharge.       Negative for urinary incontinence or retention.  Musculoskeletal: Positive for back pain.  Neurological: Positive for weakness (Subjectively). Negative for numbness.       Denies saddle paresthesias.     Physical Exam Updated Vital Signs BP 112/74 (BP Location: Left Arm)   Pulse 81   Temp 99 F (37.2 C) (Oral)   Resp 16   LMP 07/27/2019   SpO2 100%   Physical Exam Vitals signs and nursing note reviewed.  Constitutional:      Appearance: She is well-developed.  HENT:     Head: Normocephalic and atraumatic.  Eyes:     Conjunctiva/sclera: Conjunctivae normal.  Neck:     Musculoskeletal: Normal range of motion and neck supple.  Pulmonary:     Effort: Pulmonary effort is normal.  Abdominal:     Palpations: Abdomen is soft.     Tenderness: There is no abdominal tenderness.  Musculoskeletal: Normal range of motion.     Cervical back: She exhibits normal range of motion, no tenderness and no bony tenderness.     Thoracic back: She exhibits normal range of motion, no tenderness and no bony tenderness.     Lumbar back: She exhibits normal range of motion, no tenderness and no bony tenderness.  Comments: No step-off noted with palpation of spine.  Patient can bend over and touch her toes without any difficulty and stands from a seated position and walks across the room without any difficulty, foot drop, imbalance.  Skin:    General: Skin is warm and dry.     Findings: No rash.  Neurological:     Mental Status: She is alert.     Sensory: No sensory deficit.     Deep Tendon Reflexes: Reflexes are normal and symmetric.     Comments: 5/5 strength in entire lower extremities bilaterally. No sensation deficit.       ED Treatments / Results  Labs (all labs ordered are listed, but only abnormal  results are displayed) Labs Reviewed - No data to display  EKG None  Radiology No results found.  Procedures Procedures (including critical care time)  Medications Ordered in ED Medications - No data to display   Initial Impression / Assessment and Plan / ED Course  I have reviewed the triage vital signs and the nursing notes.  Pertinent labs & imaging results that were available during my care of the patient were reviewed by me and considered in my medical decision making (see chart for details).        10:51 AM Patient seen and examined.  Exam is reassuring.  Counseled on watchful waiting at this time with follow-up if symptoms do not improve.  Vital signs reviewed and are as follows: Vitals:   08/15/19 0931  BP: 112/74  Pulse: 81  Resp: 16  Temp: 99 F (37.2 C)  SpO2: 100%    No red flag s/s of low back pain. Patient was counseled on back pain precautions and told to do activity as tolerated but do not lift, push, or pull heavy objects more than 10 pounds for the next week.  Patient counseled to use ice or heat on back for no longer than 15 minutes every hour.   She will continue Tylenol and/or NSAIDs for symptom control.   Patient urged to follow-up with PCP if pain does not improve with treatment in the next 2 weeks and rest or if pain becomes recurrent. Urged to return with worsening severe pain, loss of bowel or bladder control, trouble walking.   The patient verbalizes understanding and agrees with the plan.  Final Clinical Impressions(s) / ED Diagnoses   Final diagnoses:  Acute bilateral low back pain without sciatica   Patient with back pain. No neurological deficits. Patient is ambulatory. No warning symptoms of back pain including: fecal incontinence, urinary retention or overflow incontinence, night sweats, waking from sleep with back pain, unexplained fevers or weight loss, h/o cancer, IVDU, recent trauma. No concern for cauda equina, epidural abscess,  or other serious cause of back pain. Conservative measures such as rest, ice/heat and pain medicine indicated with PCP follow-up if no improvement with conservative management.    ED Discharge Orders    None       Carlisle Cater, Hershal Coria 08/15/19 1055    Davonna Belling, MD 08/15/19 1459

## 2019-08-17 ENCOUNTER — Other Ambulatory Visit: Payer: Self-pay

## 2019-08-17 DIAGNOSIS — Z20822 Contact with and (suspected) exposure to covid-19: Secondary | ICD-10-CM

## 2019-08-18 LAB — NOVEL CORONAVIRUS, NAA: SARS-CoV-2, NAA: DETECTED — AB

## 2019-08-20 ENCOUNTER — Telehealth: Payer: Self-pay | Admitting: Critical Care Medicine

## 2019-08-20 NOTE — Telephone Encounter (Signed)
I connected with this patient who began to become ill on August 19 and then came to our [facility] testing event August 22 tested positive for COVID.  She had muscle aches fever headaches dizziness and cough.  She is gradually improving at this time.  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  Apparently a childcare facility has now been shut down.  The health department is aware of this outbreak.  I indicated to her that she needs to stay in isolation until August 31.  The early she should be retested is September 2.  If she worsens she is to go to the emergency room or urgent care and she is aware of this but it sounds like she is rapidly improving.  The health department will be notified

## 2019-09-23 ENCOUNTER — Other Ambulatory Visit: Payer: Self-pay

## 2019-09-23 ENCOUNTER — Emergency Department (HOSPITAL_COMMUNITY)
Admission: EM | Admit: 2019-09-23 | Discharge: 2019-09-23 | Discharge status: Against Medical Advice | Payer: BLUE CROSS/BLUE SHIELD

## 2019-09-24 ENCOUNTER — Encounter (HOSPITAL_COMMUNITY): Payer: Self-pay | Admitting: Emergency Medicine

## 2019-09-24 ENCOUNTER — Emergency Department (HOSPITAL_COMMUNITY)
Admission: EM | Admit: 2019-09-24 | Discharge: 2019-09-24 | Disposition: A | Discharge status: Home / Self Care | Payer: BLUE CROSS/BLUE SHIELD | Attending: Emergency Medicine | Admitting: Emergency Medicine

## 2019-09-24 ENCOUNTER — Other Ambulatory Visit: Payer: Self-pay

## 2019-09-24 DIAGNOSIS — H60393 Other infective otitis externa, bilateral: Secondary | ICD-10-CM | POA: Diagnosis not present

## 2019-09-24 DIAGNOSIS — H9203 Otalgia, bilateral: Secondary | ICD-10-CM | POA: Diagnosis present

## 2019-09-24 LAB — CBG MONITORING, ED: Glucose-Capillary: 75 mg/dL (ref 70–99)

## 2019-09-24 MED ORDER — CIPROFLOXACIN-DEXAMETHASONE 0.3-0.1 % OT SUSP: 4.0000 [drp] | Freq: Two times a day (BID) | OTIC | 0 refills | Status: DC

## 2019-09-24 NOTE — ED Provider Notes (Signed)
[facility] DEPT Provider Note   CSN: [id number] Arrival date & time: 09/24/19  5809     History   Chief Complaint Chief Complaint  Patient presents with  . Otalgia    HPI Barbara Conley is a 28 y.o. female.     28 yo F with a chief complaint of bilateral ear pain.  Going on for the past 3 days or so.  Describes it is extremely itchy.  She denies fevers or chills denies getting water stuck in her ear denies swimming or scuba diving.  She denies cough or congestion.  No sick contacts.  No problem with her ears in the past far she knows.  The history is provided by the patient.  Otalgia Location:  Bilateral Behind ear:  No abnormality Quality:  Aching (itching) Severity:  No pain Onset quality:  Gradual Duration:  3 days Timing:  Constant Progression:  Worsening Chronicity:  New Context: not direct blow   Relieved by:  Nothing Worsened by:  Nothing Ineffective treatments:  None tried Associated symptoms: no congestion, no fever, no headaches, no rhinorrhea and no vomiting     History reviewed. No pertinent past medical history.  There are no active problems to display for this patient.   History reviewed. No pertinent surgical history.   OB History   No obstetric history on file.      Home Medications    Prior to Admission medications   Medication Sig Start Date End Date Taking? Authorizing Provider  acetaminophen (TYLENOL) 325 MG tablet Take 650 mg by mouth every 6 (six) hours as needed for mild pain or headache.   Yes [provider]  ciprofloxacin-dexamethasone (CIPRODEX) OTIC suspension Place 4 drops into both ears 2 (two) times daily. 09/24/19   Deno Etienne, DO    Family History No family history on file.  Social History Social History   Tobacco Use  . Smoking status: Never Smoker  . Smokeless tobacco: Never Used  Substance Use Topics  . Alcohol use: No  . Drug use: No     Allergies   Patient has  no known allergies.   Review of Systems Review of Systems  Constitutional: Negative for chills and fever.  HENT: Positive for ear pain. Negative for congestion and rhinorrhea.   Eyes: Negative for redness and visual disturbance.  Respiratory: Negative for shortness of breath and wheezing.   Cardiovascular: Negative for chest pain and palpitations.  Gastrointestinal: Negative for nausea and vomiting.  Genitourinary: Negative for dysuria and urgency.  Musculoskeletal: Negative for arthralgias and myalgias.  Skin: Negative for pallor and wound.  Neurological: Negative for dizziness and headaches.     Physical Exam Updated Vital Signs BP 120/75   Pulse 71   Temp 99.4 F (37.4 C)   Resp 16   LMP 08/27/2019   SpO2 100%   Physical Exam Vitals signs and nursing note reviewed.  Constitutional:      General: She is not in acute distress.    Appearance: She is well-developed. She is not diaphoretic.  HENT:     Head: Normocephalic and atraumatic.     Comments: Patient has some thickening of the skin and some whitish appearing discharge just at the entrance to the external ear canal.  The TM is without effusion or erythema or bulging bilaterally. Eyes:     Pupils: Pupils are equal, round, and reactive to light.  Neck:     Musculoskeletal: Normal range of motion and neck supple.  Cardiovascular:     Rate and Rhythm: Normal rate and regular rhythm.     Heart sounds: No murmur. No friction rub. No gallop.   Pulmonary:     Effort: Pulmonary effort is normal.     Breath sounds: No wheezing or rales.  Abdominal:     General: There is no distension.     Palpations: Abdomen is soft.     Tenderness: There is no abdominal tenderness.  Musculoskeletal:        General: No tenderness.  Skin:    General: Skin is warm and dry.  Neurological:     Mental Status: She is alert and oriented to person, place, and time.  Psychiatric:        Behavior: Behavior normal.      ED Treatments /  Results  Labs (all labs ordered are listed, but only abnormal results are displayed) Labs Reviewed  CBG MONITORING, ED    EKG None  Radiology No results found.  Procedures Procedures (including critical care time)  Medications Ordered in ED Medications - No data to display   Initial Impression / Assessment and Plan / ED Course  I have reviewed the triage vital signs and the nursing notes.  Pertinent labs & imaging results that were available during my care of the patient were reviewed by me and considered in my medical decision making (see chart for details).        28 yo F with a chief complaint of bilateral ear pain.  Going on for about 3 days.  Clinically is not consistent with the typical otitis externa that I would see.  Appears to be more chronic skin changes with some whitish appearing exudate.  I wonder if this is fungal in nature.  She has no known medical problems.  She denies any water or anything stuck in the ear.  Will obtain a fingerstick blood sugar.  Will treat for possible bacterial cause.  Have her try and keep her ears dry otherwise.  Given ENT follow-up if this does not improve.  CBG normal.  D/c home.   11:47 AM:  I have discussed the diagnosis/risks/treatment options with the patient and believe the pt to be eligible for discharge home to follow-up with PCP, ENT. We also discussed returning to the ED immediately if new or worsening sx occur. We discussed the sx which are most concerning (e.g., sudden worsening pain, fever, inability to tolerate by mouth) that necessitate immediate return. Medications administered to the patient during their visit and any new prescriptions provided to the patient are listed below.  Medications given during this visit Medications - No data to display   The patient appears reasonably screen and/or stabilized for discharge and I doubt any other medical condition or other [facility] requiring further screening, evaluation, or  treatment in the ED at this time prior to discharge.    Final Clinical Impressions(s) / ED Diagnoses   Final diagnoses:  Other infective acute otitis externa of both ears    ED Discharge Orders         Ordered    ciprofloxacin-dexamethasone (CIPRODEX) OTIC suspension  2 times daily     09/24/19 1143           Melene Plan, DO 09/24/19 1147

## 2019-09-24 NOTE — ED Triage Notes (Signed)
Pt c/o bilat ear pains for 3 days. Took tylenol around 730-8am today.

## 2019-09-24 NOTE — Discharge Instructions (Signed)
Other than the eardrops please keep your ears dry.  Do not allow water to go into them when taking a bath or showering.  Commercial earplugs will keep the water out during this process.  If your symptoms persist after antibiotic therapy please follow-up with an ear nose and throat physician.

## 2019-11-12 ENCOUNTER — Other Ambulatory Visit: Payer: Self-pay

## 2019-11-12 ENCOUNTER — Emergency Department (HOSPITAL_COMMUNITY): Payer: BLUE CROSS/BLUE SHIELD

## 2019-11-12 ENCOUNTER — Emergency Department (HOSPITAL_COMMUNITY)
Admission: EM | Admit: 2019-11-12 | Discharge: 2019-11-12 | Disposition: A | Discharge status: Home / Self Care | Payer: BLUE CROSS/BLUE SHIELD | Attending: Emergency Medicine | Admitting: Emergency Medicine

## 2019-11-12 DIAGNOSIS — R0602 Shortness of breath: Secondary | ICD-10-CM | POA: Diagnosis not present

## 2019-11-12 DIAGNOSIS — R42 Dizziness and giddiness: Secondary | ICD-10-CM | POA: Diagnosis not present

## 2019-11-12 DIAGNOSIS — R079 Chest pain, unspecified: Secondary | ICD-10-CM | POA: Diagnosis not present

## 2019-11-12 DIAGNOSIS — Z20828 Contact with and (suspected) exposure to other viral communicable diseases: Secondary | ICD-10-CM | POA: Diagnosis not present

## 2019-11-12 LAB — CBC WITH DIFFERENTIAL/PLATELET
Abs Immature Granulocytes: 0 10*3/uL (ref 0.00–0.07)
Basophils Absolute: 0 10*3/uL (ref 0.0–0.1)
Basophils Relative: 0 %
Eosinophils Absolute: 0 10*3/uL (ref 0.0–0.5)
Eosinophils Relative: 1 %
HCT: 33.4 % — ABNORMAL LOW (ref 36.0–46.0)
Hemoglobin: 10.3 g/dL — ABNORMAL LOW (ref 12.0–15.0)
Immature Granulocytes: 0 %
Lymphocytes Relative: 35 %
Lymphs Abs: 1.3 10*3/uL (ref 0.7–4.0)
MCH: 22.3 pg — ABNORMAL LOW (ref 26.0–34.0)
MCHC: 30.8 g/dL (ref 30.0–36.0)
MCV: 72.5 fL — ABNORMAL LOW (ref 80.0–100.0)
Monocytes Absolute: 0.3 10*3/uL (ref 0.1–1.0)
Monocytes Relative: 9 %
Neutro Abs: 1.9 10*3/uL (ref 1.7–7.7)
Neutrophils Relative %: 55 %
Platelets: 290 10*3/uL (ref 150–400)
RBC: 4.61 MIL/uL (ref 3.87–5.11)
RDW: 15.4 % (ref 11.5–15.5)
WBC: 3.6 10*3/uL — ABNORMAL LOW (ref 4.0–10.5)
nRBC: 0 % (ref 0.0–0.2)

## 2019-11-12 LAB — BASIC METABOLIC PANEL
Anion gap: 8 (ref 5–15)
BUN: 9 mg/dL (ref 6–20)
CO2: 26 mmol/L (ref 22–32)
Calcium: 8.9 mg/dL (ref 8.9–10.3)
Chloride: 104 mmol/L (ref 98–111)
Creatinine, Ser: 0.77 mg/dL (ref 0.44–1.00)
GFR calc Af Amer: >60 mL/min (ref >60)
GFR calc non Af Amer: >60 mL/min (ref >60)
Glucose, Bld: 91 mg/dL (ref 70–99)
Potassium: 3.8 mmol/L (ref 3.5–5.1)
Sodium: 138 mmol/L (ref 135–145)

## 2019-11-12 LAB — D-DIMER, QUANTITATIVE (NOT AT ARMC): D-Dimer, Quant: <0.27 ug/mL-FEU (ref 0.00–0.50)

## 2019-11-12 LAB — SARS CORONAVIRUS 2 (TAT 6-24 HRS): SARS Coronavirus 2: NEGATIVE

## 2019-11-12 LAB — I-STAT BETA HCG BLOOD, ED (MC, WL, AP ONLY): I-stat hCG, quantitative: <5 m[IU]/mL (ref <5)

## 2019-11-12 LAB — TROPONIN I (HIGH SENSITIVITY): Troponin I (High Sensitivity): <2 ng/L (ref <18)

## 2019-11-12 MED ORDER — ALBUTEROL SULFATE HFA 108 (90 BASE) MCG/ACT IN AERS: 1.0000 | INHALATION_SPRAY | Freq: Four times a day (QID) | RESPIRATORY_TRACT | 0 refills | Status: AC | PRN

## 2019-11-12 MED ORDER — SODIUM CHLORIDE 0.9 % IV BOLUS
1000.0000 mL | Freq: Once | INTRAVENOUS | Status: AC
  Administered 2019-11-12: 1000 mL via INTRAVENOUS

## 2019-11-12 NOTE — ED Triage Notes (Signed)
Pt here today with complaints of shortness of breath x1 week. Pt states the shortness of breath comes while resting and with activity. 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 Pt denies fevers or cough.

## 2019-11-12 NOTE — ED Notes (Signed)
X-ray at bedside

## 2019-11-12 NOTE — Discharge Instructions (Signed)
You were seen in the emergency department today with shortness of breath and lightheadedness.  Your lab work is reassuring.  I am testing you for COVID-19.  You will need to remain in isolation until you are test comes back.  You can follow the results in [place].  I can see in our system that she did test positive for COVID-19 in August and the symptoms could be related to residual symptoms from that diagnosis.  Call the primary care doctor listed to schedule a follow-up appointment and return to the emergency department with any new or suddenly worsening symptoms.

## 2019-11-12 NOTE — ED Provider Notes (Signed)
Emergency Department Provider Note   I have reviewed the triage vital signs and the nursing notes.   HISTORY  Chief Complaint Shortness of Breath   HPI Barbara Conley is a 28 y.o. female presents to the emergency department for evaluation of shortness of breath with intermittent lightheadedness.  She has some associated centralized chest pressure.  She describes these episodes as occurring intermittently and without clear provoking factors.  No modifying factors.  No fevers or chills.  No anosmia.  Denies vomiting or diarrhea symptoms.  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 No HA.   No past medical history on file.  There are no active problems to display for this patient.   No past surgical history on file.  Allergies Patient has no known allergies.  No family history on file.  Social History Social History   Tobacco Use  . Smoking status: Never Smoker  . Smokeless tobacco: Never Used  Substance Use Topics  . Alcohol use: No  . Drug use: No    Review of Systems  Constitutional: No fever/chills. Intermittent lightheadedness.  Eyes: No visual changes. ENT: No sore throat. Cardiovascular: Intermittent chest pain. Respiratory: Intermittent shortness of breath. Gastrointestinal: No abdominal pain.  No nausea, no vomiting.  No diarrhea.  No constipation. Genitourinary: Negative for dysuria. Musculoskeletal: Negative for back pain. Skin: Negative for rash. Neurological: Negative for headaches, focal weakness or numbness.  10-point ROS otherwise negative.  ____________________________________________   PHYSICAL EXAM:  VITAL SIGNS: ED Triage Vitals  Enc Vitals Group     BP 11/12/19 0828 94/61     Pulse Rate 11/12/19 0828 70     Resp 11/12/19 0828 18     Temp 11/12/19 0828 98.7 F (37.1 C)     Temp Source 11/12/19 0828 Oral     SpO2 11/12/19 0828 100 %   Constitutional: Alert and oriented. Well appearing  and in no acute distress. Eyes: Conjunctivae are normal. Head: Atraumatic. Nose: No congestion/rhinnorhea. Mouth/Throat: Mucous membranes are moist.  Neck: No stridor.   Cardiovascular: Normal rate, regular rhythm. Good peripheral circulation. Grossly normal heart sounds.   Respiratory: Normal respiratory effort.  No retractions. Lungs CTAB. Gastrointestinal: Soft and nontender. No distention.  Musculoskeletal: No lower extremity tenderness nor edema. No gross deformities of extremities. Neurologic:  Normal speech and language. No gross focal neurologic deficits are appreciated.  Skin:  Skin is warm, dry and intact. No rash noted.  ____________________________________________   LABS (all labs ordered are listed, but only abnormal results are displayed)  Labs Reviewed  CBC WITH DIFFERENTIAL/PLATELET - Abnormal; Notable for the following components:      Result Value   WBC 3.6 (*)    Hemoglobin 10.3 (*)    HCT 33.4 (*)    MCV 72.5 (*)    MCH 22.3 (*)    All other components within normal limits  SARS CORONAVIRUS 2 (TAT 6-24 HRS)  BASIC METABOLIC PANEL  D-DIMER, QUANTITATIVE (NOT AT [facility])  I-STAT BETA HCG BLOOD, ED ([facility], [facility], [facility] ONLY)  TROPONIN I (HIGH SENSITIVITY)  TROPONIN I (HIGH SENSITIVITY)   ____________________________________________  EKG   EKG Interpretation  Date/Time:  Tuesday November 12 2019 08:28:56 EST Ventricular Rate:  69 PR Interval:    QRS Duration: 81 QT Interval:  387 QTC Calculation: 415 R Axis:   84 Text Interpretation: Sinus rhythm ST elev, probable normal early repol pattern No STEMI Confirmed by Alona Bene [phone]) on 11/12/2019  8:42:33 AM       ____________________________________________  RADIOLOGY  Dg Chest Portable 1 View  Result Date: 11/12/2019 CLINICAL DATA:  Shortness of breath EXAM: PORTABLE CHEST 1 VIEW COMPARISON:  08/16/2018 FINDINGS: The heart size and mediastinal contours are within normal limits. Both lungs are clear. The  visualized skeletal structures are unremarkable. IMPRESSION: No acute abnormality of the lungs. Electronically Signed   By: Eddie Candle M.D.   On: 11/12/2019 09:42    ____________________________________________   PROCEDURES  Procedure(s) performed:   Procedures  None  ____________________________________________   INITIAL IMPRESSION / ASSESSMENT AND PLAN / ED COURSE  Pertinent labs & imaging results that were available during my care of the patient were reviewed by me and considered in my medical decision making (see chart for details).   Patient presents to the emergency department with intermittent chest discomfort with shortness of breath and lightheadedness.  She is overall low risk for PE but I will send D-dimer with somewhat elevated clinical suspicion.  Her EKG is interpreted as above with no clear ischemic findings or arrhythmia.  She is afebrile with no COVID like symptoms other than SOB. Plan for orthostatic vitals, CXR, labs, and reassess.   Labs reviewed.  Patient with mild leukopenia.  In review of her past charts the patient actually has tested positive for COVID-19 in August.  I do plan for repeat testing today.  D-dimer is negative and troponin is less than 2.  EKG is nonischemic.  Will give albuterol inhaler for symptom management as shortness of breath arises.  She feels improved after IV fluids with normal vitals.  Wrote a work note.  Discussed quarantine until Covid tests come back and provided a work note.  Patient will follow results in [place].  Information provided at discharge regarding setting up this App. Discussed ED return precautions as well.  ____________________________________________  FINAL CLINICAL IMPRESSION(S) / ED DIAGNOSES  Final diagnoses:  SOB (shortness of breath)  Episodic lightheadedness     MEDICATIONS GIVEN DURING THIS VISIT:  Medications  sodium chloride 0.9 % bolus 1,000 mL (1,000 mLs Intravenous New Bag/Given 11/12/19 0920)      NEW OUTPATIENT MEDICATIONS STARTED DURING THIS VISIT:  New Prescriptions   ALBUTEROL (VENTOLIN HFA) 108 (90 BASE) MCG/ACT INHALER    Inhale 1-2 puffs into the lungs every 6 (six) hours as needed for shortness of breath.    Note:  This document was prepared using Dragon voice recognition software and may include unintentional dictation errors.  Nanda Quinton, MD, [facility]    Long, [facility], MD 11/12/19 [phone]

## 2021-02-26 ENCOUNTER — Ambulatory Visit (HOSPITAL_COMMUNITY)
Admission: EM | Admit: 2021-02-26 | Discharge: 2021-02-26 | Disposition: A | Discharge status: Still In Hospital | Payer: BLUE CROSS/BLUE SHIELD

## 2021-02-26 ENCOUNTER — Ambulatory Visit: Payer: BLUE CROSS/BLUE SHIELD

## 2021-02-26 ENCOUNTER — Other Ambulatory Visit: Payer: Self-pay | Admitting: Family Medicine

## 2021-02-26 ENCOUNTER — Other Ambulatory Visit: Payer: Self-pay

## 2021-02-26 DIAGNOSIS — M25571 Pain in right ankle and joints of right foot: Secondary | ICD-10-CM

## 2021-02-26 DIAGNOSIS — M79671 Pain in right foot: Secondary | ICD-10-CM

## 2021-05-03 IMAGING — DX DG FOOT COMPLETE 3+V*R*
3 series · 3 of 3 positions shown · non-contrast
Comparison: None.

CLINICAL DATA: Right foot pain after fall today.

EXAM:
RIGHT FOOT COMPLETE - 3+ VIEW

[foot ap]
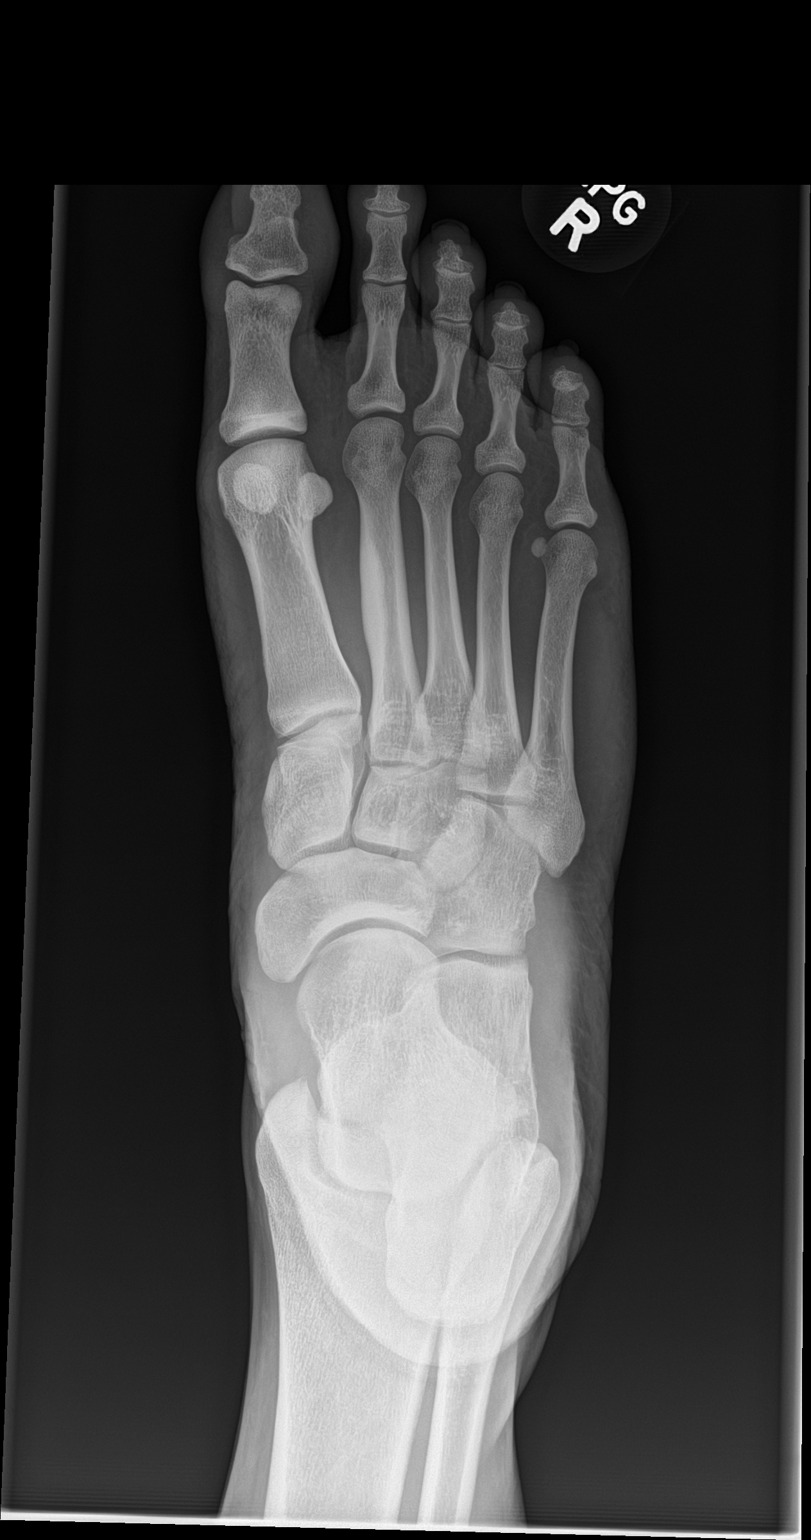

[foot obl]
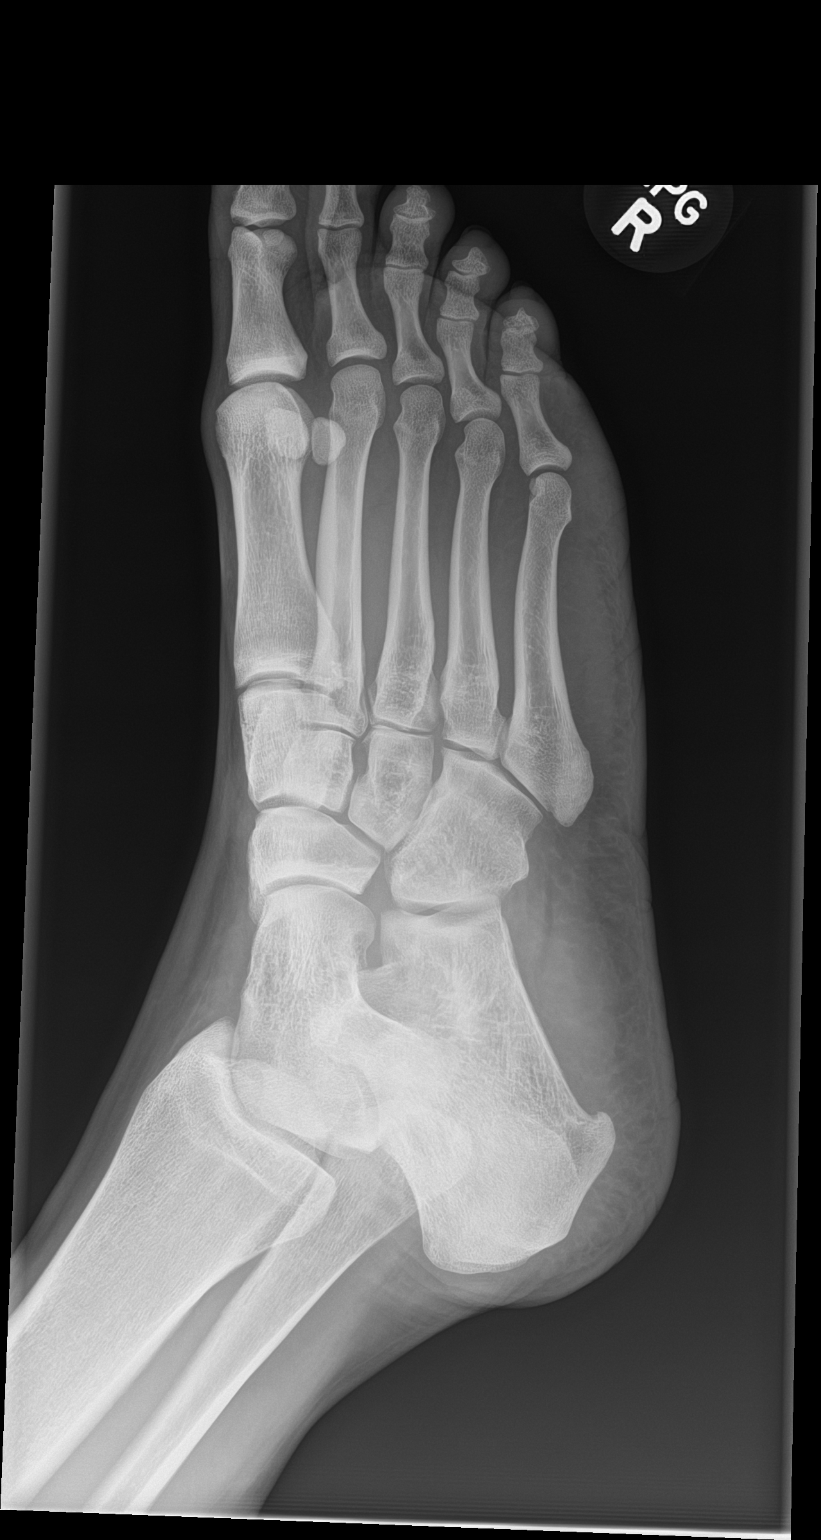

[foot lat]
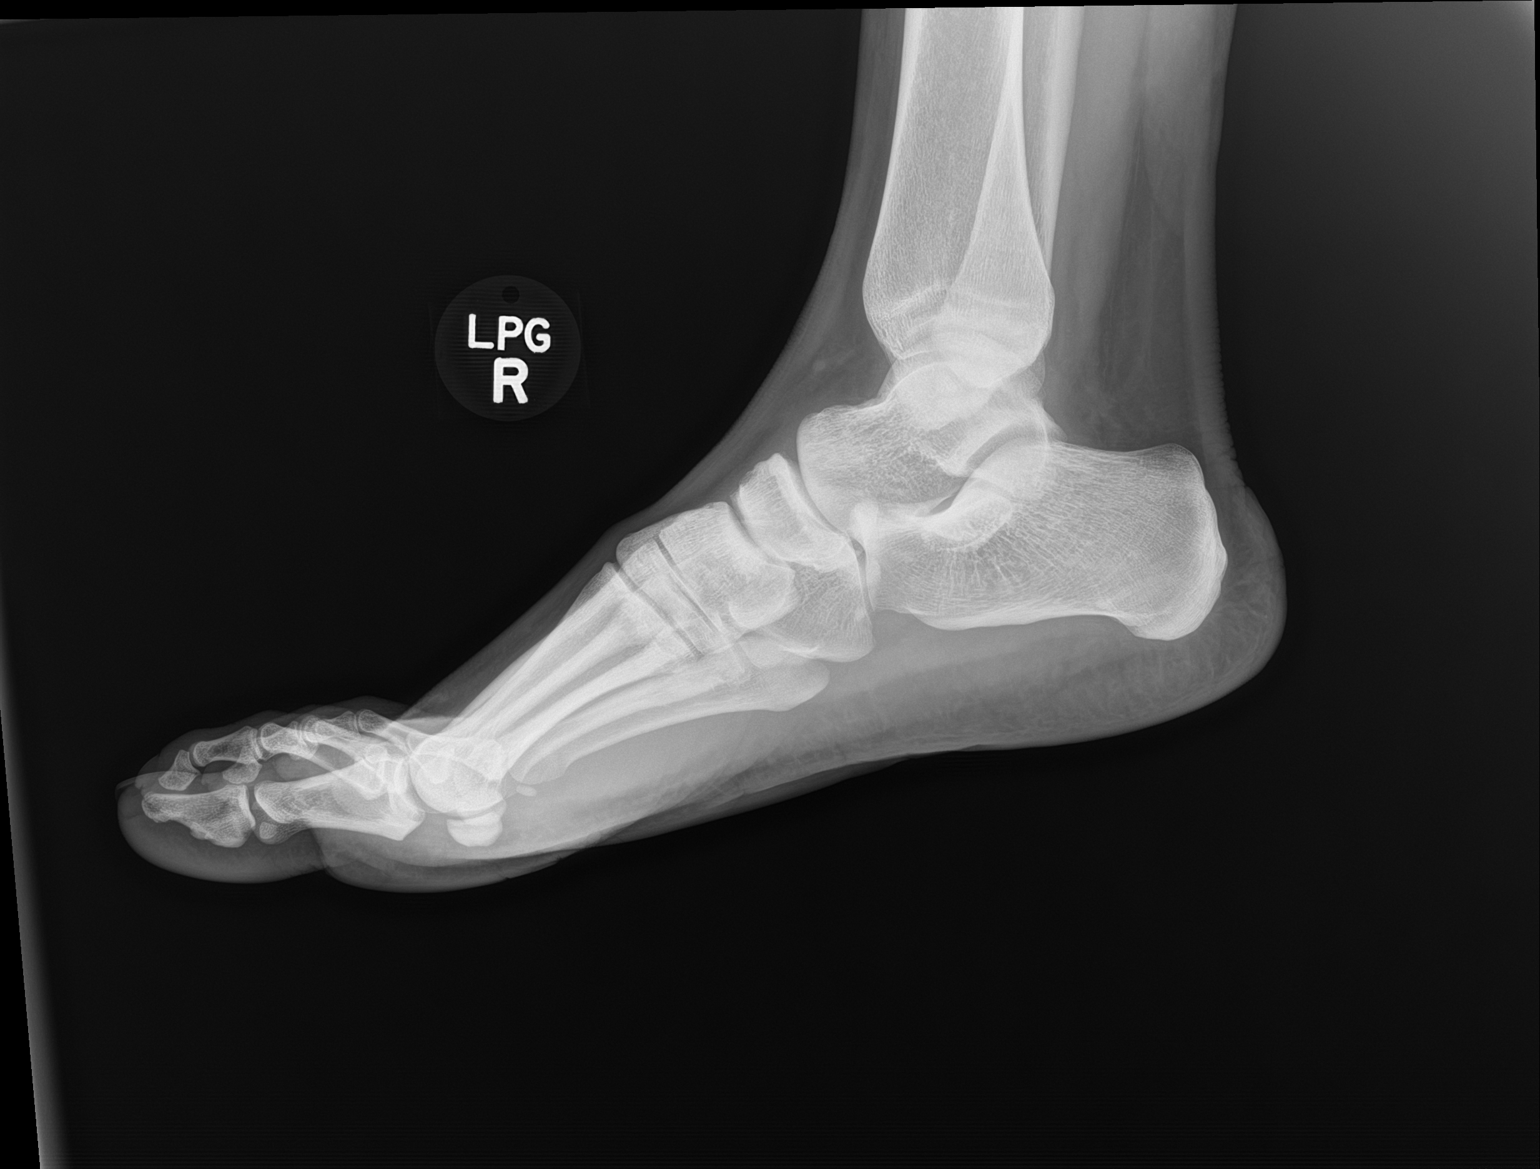

[3 of 3 positions shown; findings below may reference images not displayed]

FINDINGS: There is no evidence of fracture or dislocation. There is no
evidence of arthropathy or other focal bone abnormality. Soft
tissues are unremarkable.
IMPRESSION: Negative.

## 2023-03-25 ENCOUNTER — Ambulatory Visit
Admission: EM | Admit: 2023-03-25 | Discharge: 2023-03-25 | Disposition: A | Discharge status: Home / Self Care | Payer: BLUE CROSS/BLUE SHIELD | Attending: Urgent Care | Admitting: Urgent Care

## 2023-03-25 DIAGNOSIS — J018 Other acute sinusitis: Secondary | ICD-10-CM

## 2023-03-25 MED ORDER — PSEUDOEPHEDRINE HCL 60 MG PO TABS: 60.0000 mg | ORAL_TABLET | Freq: Three times a day (TID) | ORAL | 0 refills | Status: AC | PRN

## 2023-03-25 MED ORDER — CETIRIZINE HCL 10 MG PO TABS: 10.0000 mg | ORAL_TABLET | Freq: Every day | ORAL | 0 refills | Status: AC

## 2023-03-25 MED ORDER — AMOXICILLIN-POT CLAVULANATE 875-125 MG PO TABS: 1.0000 | ORAL_TABLET | Freq: Two times a day (BID) | ORAL | 0 refills | Status: AC

## 2023-03-25 NOTE — ED Provider Notes (Signed)
[facility]  Note:  This document was prepared using Systems analyst and may include unintentional dictation errors.  MRN: [id number] DOB: 1991-08-28  Subjective:   Barbara Conley is a 32 y.o. female presenting for 1 week history of sinus congestion, throat pain, chest congestion, mild coughing, fatigue. No shob, wheezing. No need for her inhaler lately. Started to have drainage from both eyes in the morning mostly. No smoking of any kind including cigarettes, cigars, vaping, marijuana use.    No current facility-administered medications for this encounter.  Current Outpatient Medications:    acetaminophen (TYLENOL) 325 MG tablet, Take 650 mg by mouth every 6 (six) hours as needed for mild pain or headache., Disp: , Rfl:    albuterol (VENTOLIN HFA) 108 (90 Base) MCG/ACT inhaler, Inhale 1-2 puffs into the lungs every 6 (six) hours as needed for shortness of breath., Disp: 6.7 g, Rfl: 0   No Known Allergies  PMH of asthma.    History reviewed. No pertinent surgical history.  History reviewed. No pertinent family history.  Social History   Tobacco Use   Smoking status: Never   Smokeless tobacco: Never  Vaping Use   Vaping Use: Never used  Substance Use Topics   Alcohol use: No   Drug use: No    ROS   Objective:   Vitals: BP 114/77 (BP Location: Right Arm)   Pulse 80   Temp 98 F (36.7 C) (Oral)   LMP 02/23/2023 (Within Days)   SpO2 98%   Physical Exam Constitutional:      General: She is not in acute distress.    Appearance: Normal appearance. She is well-developed and normal weight. She is not ill-appearing, toxic-appearing or diaphoretic.  HENT:     Head: Normocephalic and atraumatic.     Right Ear: Tympanic membrane, ear canal and external ear normal. No drainage or tenderness. No middle ear effusion. There is no impacted cerumen. Tympanic membrane is not erythematous or bulging.     Left Ear: Tympanic membrane,  ear canal and external ear normal. No drainage or tenderness.  No middle ear effusion. There is no impacted cerumen. Tympanic membrane is not erythematous or bulging.     Nose: Congestion present. No rhinorrhea.     Comments: Nasal mucosa boggy and edematous.    Mouth/Throat:     Mouth: Mucous membranes are moist. No oral lesions.     Pharynx: Posterior oropharyngeal erythema (with associated streaks of purulent drainage) present. No pharyngeal swelling, oropharyngeal exudate or uvula swelling.     Tonsils: No tonsillar exudate or tonsillar abscesses. 0 on the right. 0 on the left.  Eyes:     General: Lids are normal. Lids are everted, no foreign bodies appreciated. Vision grossly intact. No scleral icterus.       Right eye: No foreign body, discharge or hordeolum.        Left eye: No foreign body, discharge or hordeolum.     Extraocular Movements: Extraocular movements intact.     Right eye: Normal extraocular motion.     Left eye: Normal extraocular motion and no nystagmus.     Conjunctiva/sclera: Conjunctivae normal.     Right eye: Right conjunctiva is not injected. No chemosis, exudate or hemorrhage.    Left eye: Left conjunctiva is not injected. No chemosis, exudate or hemorrhage. Cardiovascular:     Rate and Rhythm: Normal rate and regular rhythm.     Heart sounds: Normal heart sounds. No murmur  heard.    No friction rub. No gallop.  Pulmonary:     Effort: Pulmonary effort is normal. No respiratory distress.     Breath sounds: No stridor. No wheezing, rhonchi or rales.  Chest:     Chest wall: No tenderness.  Musculoskeletal:     Cervical back: Normal range of motion and neck supple.  Lymphadenopathy:     Cervical: No cervical adenopathy.  Skin:    General: Skin is warm and dry.  Neurological:     General: No focal deficit present.     Mental Status: She is alert and oriented to person, place, and time.  Psychiatric:        Mood and Affect: Mood normal.        Behavior:  Behavior normal.     Assessment and Plan :   PDMP not reviewed this encounter.  1. Acute non-recurrent sinusitis of other sinus     Deferred imaging given clear cardiopulmonary exam, hemodynamically stable vital signs. Will start empiric treatment for sinusitis with Augmentin.  Recommended supportive care otherwise. Counseled patient on potential for adverse effects with medications prescribed/recommended today, ER and return-to-clinic precautions discussed, patient verbalized understanding.    [name], [place] 03/25/23 [phone]

## 2023-03-25 NOTE — ED Triage Notes (Signed)
Pt c/o sore throat chest congestion x 6 days, eye drainage thick green mucus upon wake up x 2 days

## 2023-03-25 NOTE — Discharge Instructions (Signed)
We will manage this as a sinus infection with Augmentin. For sore throat or cough try using a honey-based tea. Use 3 teaspoons of honey with juice squeezed from half lemon. Place shaved pieces of ginger into 1/2-1 cup of water and warm over stove top. Then mix the ingredients and repeat every 4 hours as needed. Please take ibuprofen 600mg  every 6 hours with food alternating with OR taken together with Tylenol 500mg -650mg  every 6 hours for throat pain, fevers, aches and pains. Hydrate very well with at least 2 liters of water. Eat light meals such as soups (chicken and noodles, vegetable, chicken and wild rice).  Do not eat foods that you are allergic to.  Taking an antihistamine like Zyrtec can help against postnasal drainage, sinus congestion which can cause sinus pain, sinus headaches, throat pain, painful swallowing, coughing.  You can take this together with pseudoephedrine (Sudafed) at a dose of 60 mg 3 times a day or twice daily as needed for the same kind of nasal drip, congestion.

## 2023-04-03 ENCOUNTER — Emergency Department (HOSPITAL_COMMUNITY)
Admission: EM | Admit: 2023-04-03 | Discharge: 2023-04-03 | Disposition: A | Discharge status: Home / Self Care | Payer: BLUE CROSS/BLUE SHIELD | Attending: Emergency Medicine | Admitting: Emergency Medicine

## 2023-04-03 ENCOUNTER — Other Ambulatory Visit: Payer: Self-pay

## 2023-04-03 DIAGNOSIS — Z20822 Contact with and (suspected) exposure to covid-19: Secondary | ICD-10-CM | POA: Diagnosis not present

## 2023-04-03 DIAGNOSIS — B349 Viral infection, unspecified: Secondary | ICD-10-CM

## 2023-04-03 DIAGNOSIS — R197 Diarrhea, unspecified: Secondary | ICD-10-CM | POA: Diagnosis not present

## 2023-04-03 DIAGNOSIS — J029 Acute pharyngitis, unspecified: Secondary | ICD-10-CM | POA: Diagnosis present

## 2023-04-03 DIAGNOSIS — R519 Headache, unspecified: Secondary | ICD-10-CM | POA: Insufficient documentation

## 2023-04-03 LAB — RESP PANEL BY RT-PCR (RSV, FLU A&B, COVID)  RVPGX2
Influenza A by PCR: NEGATIVE
Influenza B by PCR: NEGATIVE
Resp Syncytial Virus by PCR: NEGATIVE
SARS Coronavirus 2 by RT PCR: NEGATIVE

## 2023-04-03 LAB — GROUP A STREP BY PCR: Group A Strep by PCR: NOT DETECTED

## 2023-04-03 NOTE — ED Triage Notes (Addendum)
C/o sore throat, congestion x2 weeks and started having diarrhea, headache, and body aches yesterday.  Recent abx treatment on 3/30 for sinusitis w/o improvement. Marland Kitchen

## 2023-04-03 NOTE — Discharge Instructions (Signed)
Your COVID, flu, strep test were all negative today.  Use over-the-counter medications as directed

## 2023-04-03 NOTE — ED Provider Notes (Signed)
[place] EMERGENCY DEPARTMENT AT [facility] Provider Note   CSN: [id number] Arrival date & time: 04/03/23  0815     History  Chief Complaint  Patient presents with   Sore Throat   Headache   Diarrhea    Barbara Conley is a 32 y.o. female.  32 year old female presents with 24 hours of myalgias watery diarrhea congestion.  Patient denies any fever or emesis.  Does work in Audiological scientist.  Headache without photophobia or neck pain.  Denies any earaches.  Recent antibiotic for sinusitis without improvement.       Home Medications Prior to Admission medications   Medication Sig Start Date End Date Taking? Authorizing Provider  acetaminophen (TYLENOL) 325 MG tablet Take 650 mg by mouth every 6 (six) hours as needed for mild pain or headache.    [provider]  albuterol (VENTOLIN HFA) 108 (90 Base) MCG/ACT inhaler Inhale 1-2 puffs into the lungs every 6 (six) hours as needed for shortness of breath. 11/12/19   Long, Arlyss Repress, MD  amoxicillin-clavulanate (AUGMENTIN) 875-125 MG tablet Take 1 tablet by mouth 2 (two) times daily. 03/25/23   Wallis Bamberg, PA-C  cetirizine (ZYRTEC ALLERGY) 10 MG tablet Take 1 tablet (10 mg total) by mouth daily. 03/25/23   Wallis Bamberg, PA-C  pseudoephedrine (SUDAFED) 60 MG tablet Take 1 tablet (60 mg total) by mouth every 8 (eight) hours as needed for congestion. 03/25/23   Wallis Bamberg, PA-C      Allergies    Patient has no known allergies.    Review of Systems   Review of Systems  All other systems reviewed and are negative.   Physical Exam Updated Vital Signs BP (!) 128/93 (BP Location: Left Arm)   Pulse 90   Temp 99 F (37.2 C) (Oral)   Resp 18   Wt 68 kg   LMP 02/23/2023 (Within Days)   SpO2 100%   BMI 22.14 kg/m  Physical Exam Vitals and nursing note reviewed.  Constitutional:      General: She is not in acute distress.    Appearance: Normal appearance. She is well-developed. She is not toxic-appearing.  HENT:      Head: Normocephalic and atraumatic.  Eyes:     General: Lids are normal.     Conjunctiva/sclera: Conjunctivae normal.     Pupils: Pupils are equal, round, and reactive to light.  Neck:     Thyroid: No thyroid mass.     Trachea: No tracheal deviation.  Cardiovascular:     Rate and Rhythm: Normal rate and regular rhythm.     Heart sounds: Normal heart sounds. No murmur heard.    No gallop.  Pulmonary:     Effort: Pulmonary effort is normal. No respiratory distress.     Breath sounds: Normal breath sounds. No stridor. No decreased breath sounds, wheezing, rhonchi or rales.  Abdominal:     General: There is no distension.     Palpations: Abdomen is soft.     Tenderness: There is no abdominal tenderness. There is no rebound.  Musculoskeletal:        General: No tenderness. Normal range of motion.     Cervical back: Normal range of motion and neck supple.  Skin:    General: Skin is warm and dry.     Findings: No abrasion or rash.  Neurological:     Mental Status: She is alert and oriented to person, place, and time. Mental status is at baseline.  GCS: GCS eye subscore is 4. GCS verbal subscore is 5. GCS motor subscore is 6.     Cranial Nerves: No cranial nerve deficit.     Sensory: No sensory deficit.     Motor: Motor function is intact.  Psychiatric:        Attention and Perception: Attention normal.        Speech: Speech normal.        Behavior: Behavior normal.     ED Results / Procedures / Treatments   Labs (all labs ordered are listed, but only abnormal results are displayed) Labs Reviewed  RESP PANEL BY RT-PCR (RSV, FLU A&B, COVID)  RVPGX2  GROUP A STREP BY PCR    EKG None  Radiology No results found.  Procedures Procedures    Medications Ordered in ED Medications - No data to display  ED Course/ Medical Decision Making/ A&P                             Medical Decision Making  Patient's COVID, flu, strep test were all negative here.  Suspect viral  infection.  Will discharge        Final Clinical Impression(s) / ED Diagnoses Final diagnoses:  None    Rx / DC Orders ED Discharge Orders     None         Lorre Nick, MD 04/03/23 1021

## 2023-12-01 ENCOUNTER — Other Ambulatory Visit: Payer: Self-pay

## 2023-12-01 ENCOUNTER — Encounter (HOSPITAL_COMMUNITY): Payer: Self-pay

## 2023-12-01 ENCOUNTER — Emergency Department (HOSPITAL_COMMUNITY)
Admission: EM | Admit: 2023-12-01 | Discharge: 2023-12-01 | Disposition: A | Discharge status: Home / Self Care | Payer: BLUE CROSS/BLUE SHIELD | Attending: Emergency Medicine | Admitting: Emergency Medicine

## 2023-12-01 DIAGNOSIS — N939 Abnormal uterine and vaginal bleeding, unspecified: Secondary | ICD-10-CM | POA: Insufficient documentation

## 2023-12-01 LAB — BASIC METABOLIC PANEL
Anion gap: 8 (ref 5–15)
BUN: 14 mg/dL (ref 6–20)
CO2: 23 mmol/L (ref 22–32)
Calcium: 9 mg/dL (ref 8.9–10.3)
Chloride: 104 mmol/L (ref 98–111)
Creatinine, Ser: 0.73 mg/dL (ref 0.44–1.00)
GFR, Estimated: >60 mL/min (ref >60)
Glucose, Bld: 81 mg/dL (ref 70–99)
Potassium: 3.7 mmol/L (ref 3.5–5.1)
Sodium: 135 mmol/L (ref 135–145)

## 2023-12-01 LAB — CBC
HCT: 30.3 % — ABNORMAL LOW (ref 36.0–46.0)
Hemoglobin: 9.6 g/dL — ABNORMAL LOW (ref 12.0–15.0)
MCH: 22.4 pg — ABNORMAL LOW (ref 26.0–34.0)
MCHC: 31.7 g/dL (ref 30.0–36.0)
MCV: 70.6 fL — ABNORMAL LOW (ref 80.0–100.0)
Platelets: 322 10*3/uL (ref 150–400)
RBC: 4.29 MIL/uL (ref 3.87–5.11)
RDW: 15.5 % (ref 11.5–15.5)
WBC: 4.5 10*3/uL (ref 4.0–10.5)
nRBC: 0 % (ref 0.0–0.2)

## 2023-12-01 LAB — TYPE AND SCREEN
ABO/RH(D): B POS
Antibody Screen: NEGATIVE

## 2023-12-01 LAB — HCG, SERUM, QUALITATIVE: Preg, Serum: NEGATIVE

## 2023-12-01 MED ORDER — FERROUS SULFATE 325 (65 FE) MG PO TABS: 325.0000 mg | ORAL_TABLET | Freq: Every day | ORAL | 0 refills | Status: AC

## 2023-12-01 NOTE — Discharge Instructions (Addendum)
Thank you for letting us evaluate you today. Your labs are significant for mildly low blood counts. You seem to be normally anemia (low blood levels) and probably should be taking iron supplements. I have sent iron supplements to your [place] pharmacy on [facility] to take once a day. I have only provided a 30 day supply so your PCP or GYN can continue to manage this in future. I have also provided a recommendation for PCP if you do not have one for routine medical management.  Return to emergency department if you experience significant vaginal bleeding and soaked through more than 1-2 pads an hour, have dizziness, loss of consciousness, altered mentation. Otherwise please follow up with your GYN for vaginal bleeding

## 2023-12-01 NOTE — ED Triage Notes (Signed)
Patient reports vaginal bleeding x 3 months. States it comes and goes, but usually it is at least once a week for at least 3 days.

## 2023-12-02 NOTE — ED Provider Notes (Signed)
[place] EMERGENCY DEPARTMENT AT [facility] Provider Note   CSN: [id number] Arrival date & time: 12/01/23  1537     History  Chief Complaint  Patient presents with   Vaginal Bleeding    Barbara Conley is a 32 y.o. female with no known past medical history presents to emergency department for evaluation of vaginal bleeding.  She reports that she may have 1 to 3 days of spotting to moderate bleeding each week in addition to her normal menses once a month. She reports that she is currently spotting. She has not had bleeding significant enough to fill one pad within an hour. She denies acute dizziness, lightheadedness, syncope, vaginal pain, concern for STD, fevers, urinary symptoms.  She has started new patient paperwork for GYN office but does not yet have a scheduled appointment   Vaginal Bleeding Associated symptoms: no abdominal pain, no dizziness, no fatigue, no fever and no nausea      Home Medications Prior to Admission medications   Medication Sig Start Date End Date Taking? Authorizing Provider  ferrous sulfate 325 (65 FE) MG tablet Take 1 tablet (325 mg total) by mouth daily. 12/01/23  Yes Judithann Sheen, PA  acetaminophen (TYLENOL) 325 MG tablet Take 650 mg by mouth every 6 (six) hours as needed for mild pain or headache.    [provider]  albuterol (VENTOLIN HFA) 108 (90 Base) MCG/ACT inhaler Inhale 1-2 puffs into the lungs every 6 (six) hours as needed for shortness of breath. 11/12/19   Long, Arlyss Repress, MD  amoxicillin-clavulanate (AUGMENTIN) 875-125 MG tablet Take 1 tablet by mouth 2 (two) times daily. 03/25/23   Wallis Bamberg, PA-C  cetirizine (ZYRTEC ALLERGY) 10 MG tablet Take 1 tablet (10 mg total) by mouth daily. 03/25/23   Wallis Bamberg, PA-C  pseudoephedrine (SUDAFED) 60 MG tablet Take 1 tablet (60 mg total) by mouth every 8 (eight) hours as needed for congestion. 03/25/23   Wallis Bamberg, PA-C      Allergies    Patient has no known allergies.     Review of Systems   Review of Systems  Constitutional:  Negative for chills, fatigue and fever.  Respiratory:  Negative for cough, chest tightness, shortness of breath and wheezing.   Cardiovascular:  Negative for chest pain and palpitations.  Gastrointestinal:  Negative for abdominal pain, constipation, diarrhea, nausea and vomiting.  Genitourinary:  Positive for vaginal bleeding.  Neurological:  Negative for dizziness, seizures, weakness, light-headedness, numbness and headaches.    Physical Exam Updated Vital Signs BP 130/89   Pulse 60   Temp 98 F (36.7 C)   Resp 16   Ht 5\' 10"  (1.778 m)   Wt 70.8 kg   SpO2 100%   BMI 22.38 kg/m  Physical Exam Vitals and nursing note reviewed.  Constitutional:      General: She is not in acute distress.    Appearance: Normal appearance. She is not ill-appearing or diaphoretic.  HENT:     Head: Normocephalic and atraumatic.  Eyes:     Conjunctiva/sclera: Conjunctivae normal.  Cardiovascular:     Rate and Rhythm: Normal rate.  Pulmonary:     Effort: Pulmonary effort is normal. No respiratory distress.     Breath sounds: Normal breath sounds.  Abdominal:     General: There is no distension.     Palpations: Abdomen is soft.     Tenderness: There is no abdominal tenderness. There is no right CVA tenderness, left CVA tenderness, guarding or  rebound.     Comments: No peritoneal signs  Musculoskeletal:     Cervical back: Normal range of motion and neck supple. No rigidity.     Right lower leg: No edema.     Left lower leg: No edema.  Skin:    General: Skin is warm.     Capillary Refill: Capillary refill takes less than 2 seconds.     Coloration: Skin is not jaundiced or pale.  Neurological:     Mental Status: She is alert and oriented to person, place, and time. Mental status is at baseline.    ED Results / Procedures / Treatments   Labs (all labs ordered are listed, but only abnormal results are displayed) Labs Reviewed  CBC  - Abnormal; Notable for the following components:      Result Value   Hemoglobin 9.6 (*)    HCT 30.3 (*)    MCV 70.6 (*)    MCH 22.4 (*)    All other components within normal limits  BASIC METABOLIC PANEL  HCG, SERUM, QUALITATIVE  TYPE AND SCREEN    EKG None  Radiology No results found.  Procedures Procedures    Medications Ordered in ED Medications - No data to display  ED Course/ Medical Decision Making/ A&P                                 Medical Decision Making Amount and/or Complexity of Data Reviewed Labs: ordered.  Risk OTC drugs.   Patient presents to the ED for concern of vaginal bleeding, this involves an extensive number of treatment options, and is a complaint that carries with it a high risk of complications and morbidity.  The differential diagnosis includes pregnancy, neoplasm, cyst, poly. Not an exhaustive list.   Co morbidities that complicate the patient evaluation  Low Hgb baseline   Additional history obtained:  Additional history obtained from Nursing and Outside Medical Records   External records from outside source obtained and reviewed including triage RN note   Lab Tests:  I Ordered, and personally interpreted labs.  The pertinent results include:   Hgb 9.6 (baseline has been is 11.1-10.3)    Medicines ordered and prescription drug management:  I ordered medication including iron supplementation  for vaginal bleeding  I have reviewed the patients home medicines and have made adjustments as needed   Test Considered:  I considered a pelvic exam however do not feel that this would change treatment.  She denies vaginal pain, concern for STD, vaginal odor, vaginal discharge. Labs are reassuring    Problem List / ED Course:  Vaginal bleeding Labs significant for hemoglobin of 9.6 however baseline has ranged from 11.1-10.3 She is not having any neurological symptoms, lightheadedness, nor syncope.  She is hemodynamically  intact with no hypotension or tachycardia Will provide iron supplementation in meantime while patient waits for OB/GYN appointment   Reevaluation:  After the interventions noted above, I reevaluated the patient and found that they have :stayed the same   Social Determinants of Health:  Has GYN F/U   Dispostion:  After consideration of the diagnostic results and the patients response to treatment, I feel that the patent would benefit from outpatient management with GYN follow-up.   Discussed patient, PE, workup, disposition with Dr. Lynelle Doctor who agrees with plan  Final Clinical Impression(s) / ED Diagnoses Final diagnoses:  Vaginal bleeding    Rx / DC Orders ED Discharge  Orders          Ordered    ferrous sulfate 325 (65 FE) MG tablet  Daily        12/01/23 1936             Judithann Sheen, PA 12/02/23 1125    Linwood Dibbles, MD 12/03/23 [phone]

## 2024-04-09 ENCOUNTER — Other Ambulatory Visit: Payer: Self-pay

## 2024-04-09 ENCOUNTER — Emergency Department (HOSPITAL_COMMUNITY)
Admission: EM | Admit: 2024-04-09 | Discharge: 2024-04-09 | Disposition: A | Discharge status: Home / Self Care | Attending: Emergency Medicine | Admitting: Emergency Medicine

## 2024-04-09 ENCOUNTER — Encounter (HOSPITAL_COMMUNITY): Payer: Self-pay

## 2024-04-09 ENCOUNTER — Emergency Department (HOSPITAL_COMMUNITY)

## 2024-04-09 DIAGNOSIS — Z3A01 Less than 8 weeks gestation of pregnancy: Secondary | ICD-10-CM | POA: Insufficient documentation

## 2024-04-09 DIAGNOSIS — O208 Other hemorrhage in early pregnancy: Secondary | ICD-10-CM | POA: Insufficient documentation

## 2024-04-09 DIAGNOSIS — O209 Hemorrhage in early pregnancy, unspecified: Secondary | ICD-10-CM | POA: Diagnosis present

## 2024-04-09 LAB — CBC
HCT: 33.4 % — ABNORMAL LOW (ref 36.0–46.0)
Hemoglobin: 10.2 g/dL — ABNORMAL LOW (ref 12.0–15.0)
MCH: 22.1 pg — ABNORMAL LOW (ref 26.0–34.0)
MCHC: 30.5 g/dL (ref 30.0–36.0)
MCV: 72.3 fL — ABNORMAL LOW (ref 80.0–100.0)
Platelets: 334 10*3/uL (ref 150–400)
RBC: 4.62 MIL/uL (ref 3.87–5.11)
RDW: 16.2 % — ABNORMAL HIGH (ref 11.5–15.5)
WBC: 5.7 10*3/uL (ref 4.0–10.5)
nRBC: 0 % (ref 0.0–0.2)

## 2024-04-09 LAB — HCG, QUANTITATIVE, PREGNANCY: hCG, Beta Chain, Quant, S: 33718 m[IU]/mL — ABNORMAL HIGH (ref <5)

## 2024-04-09 LAB — HCG, SERUM, QUALITATIVE: Preg, Serum: POSITIVE — AB

## 2024-04-09 NOTE — ED Provider Notes (Signed)
 Patient received in sign-out from O. Zelaya, PA, awaiting completion of ultrasound.  Results for orders placed or performed during the hospital encounter of 04/09/24  hCG, serum, qualitative   Collection Time: 04/09/24 11:34 AM  Result Value Ref Range   Preg, Serum POSITIVE (A) NEGATIVE  CBC   Collection Time: 04/09/24 11:34 AM  Result Value Ref Range   WBC 5.7 4.0 - 10.5 K/uL   RBC 4.62 3.87 - 5.11 MIL/uL   Hemoglobin 10.2 (L) 12.0 - 15.0 g/dL   HCT 40.9 (L) 81.1 - 91.4 %   MCV 72.3 (L) 80.0 - 100.0 fL   MCH 22.1 (L) 26.0 - 34.0 pg   MCHC 30.5 30.0 - 36.0 g/dL   RDW 78.2 (H) 95.6 - 21.3 %   Platelets 334 150 - 400 K/uL   nRBC 0.0 0.0 - 0.2 %  hCG, quantitative, pregnancy   Collection Time: 04/09/24 12:00 PM  Result Value Ref Range   hCG, Beta Chain, Quant, S 33,718 (H) <5 mIU/mL   US  OB LESS THAN 14 WEEKS WITH OB TRANSVAGINAL Result Date: 04/09/2024 CLINICAL DATA:  Vaginal bleeding. EXAM: OBSTETRIC <14 WK US  AND TRANSVAGINAL OB US  TECHNIQUE: Both transabdominal and transvaginal ultrasound examinations were performed for complete evaluation of the gestation as well as the maternal uterus, adnexal regions, and pelvic cul-de-sac. Transvaginal technique was performed to assess early pregnancy. COMPARISON:  None Available. FINDINGS: Intrauterine gestational sac: Single. Note is made that the gestational sac is within the mid to lower portion of the endometrial cavity. Yolk sac:  Visualized. Embryo:  Visualized. Cardiac Activity: Visualized. Heart Rate: 107 bpm CRL:  3.6 mm   6 w   0 d                  US  EDC: 12/03/2024 Subchorionic hemorrhage: Small subchronic hemorrhage is seen inferior to the gestational sac on transabdominal imaging. Maternal uterus/adnexae: Corpus luteum cyst in the left ovary measures 3.5 x 2.7 x 2.6 cm. Right ovary is within normal limits. There is trace free fluid in the pelvis. IMPRESSION: 1. Single live intrauterine gestation measuring 6 weeks 0 days by crown-rump  length. Note is made that the gestational sac is slightly low lying in the endometrial cavity. 2. Small subchorionic hemorrhage. 3. Likely corpus luteum cyst in the left ovary. 4. Trace free fluid in the pelvis. Electronically Signed   By: Tyron Gallon M.D.   On: 04/09/2024 17:28     Small subchorionic hemorrhage noted. Patient without abdominal pain or significant vaginal bleeding. Patient will be discharged home with close follow-up with her OB. Care instructions and return precautions provided. Patient appears safe for discharge at this time.   Gigi Kyle, NP 04/09/24 2241    Quinn Bucco, DO 04/10/24 1527

## 2024-04-09 NOTE — ED Triage Notes (Signed)
 Pt reports being recently pregnant lmp in march. Pt states that she found out last weekend. Pt has been having bright red bleeding with clots this morning along with lower back pain and abdominal pain.

## 2024-04-09 NOTE — ED Provider Notes (Addendum)
 [place] EMERGENCY DEPARTMENT AT [facility] Provider Note   CSN: [id number] Arrival date & time: 04/09/24  1126     History Chief Complaint  Patient presents with   Threatened Miscarriage    Barbara Conley is a 33 y.o. female.  Patient presents to the emergency department today with concerns of vaginal bleeding.  She reports that her last menstrual period was in March and she has had a positive pregnancy test at home.  She reports she found out she was pregnant this last weekend.  States he began to have bright red bleeding with clots present this morning.  She endorses some low back pain as well as some abdominal discomfort in the lower abdomen.  History of prior pregnancies have been largely uncomplicated.  She is set [facility] with [facility] this current [facility].  Denies any blood thinner use or history of coagulation disorders.  HPI     Home Medications Prior to Admission medications   Medication Sig Start Date End Date Taking? Authorizing Provider  acetaminophen  (TYLENOL ) 325 MG tablet Take 650 mg by mouth every 6 (six) hours as needed for mild pain or headache.    [provider]  albuterol  (VENTOLIN  HFA) 108 (90 Base) MCG/ACT inhaler Inhale 1-2 puffs into the lungs every 6 (six) hours as needed for shortness of breath. 11/12/19   Long, Joshua G, MD  amoxicillin -clavulanate (AUGMENTIN ) 875-125 MG tablet Take 1 tablet by mouth 2 (two) times daily. 03/25/23   Adolph Hoop, PA-C  cetirizine  (ZYRTEC  ALLERGY) 10 MG tablet Take 1 tablet (10 mg total) by mouth daily. 03/25/23   Adolph Hoop, PA-C  ferrous sulfate  325 (65 FE) MG tablet Take 1 tablet (325 mg total) by mouth daily. 12/01/23   Royann Cords, PA  pseudoephedrine  (SUDAFED) 60 MG tablet Take 1 tablet (60 mg total) by mouth every 8 (eight) hours as needed for congestion. 03/25/23   Adolph Hoop, PA-C      Allergies    Patient has no known allergies.    Review of Systems   Review of  Systems  Genitourinary:  Positive for vaginal bleeding.  All other systems reviewed and are negative.   Physical Exam Updated Vital Signs BP 127/72   Pulse 65   Temp 98.6 F (37 C) (Oral)   Resp 16   Ht 5\' 10"  (1.778 m)   Wt 70.8 kg   LMP 03/03/2024 (Exact Date)   SpO2 100%   BMI 22.40 kg/m  Physical Exam Vitals and nursing note reviewed. Exam conducted with a chaperone present.  Constitutional:      General: She is not in acute distress.    Appearance: She is well-developed.  HENT:     Head: Normocephalic and atraumatic.  Eyes:     Conjunctiva/sclera: Conjunctivae normal.  Cardiovascular:     Rate and Rhythm: Normal rate and regular rhythm.     Heart sounds: No murmur heard. Pulmonary:     Effort: Pulmonary effort is normal. No respiratory distress.     Breath sounds: Normal breath sounds.  Abdominal:     Palpations: Abdomen is soft.     Tenderness: There is no abdominal tenderness.  Genitourinary:    Cervix: Cervical bleeding present.     Comments: Bleeding coming from the cervical os with no apparent dilation at this time.  No obvious products of conception seen in vaginal vault but bleeding present. Musculoskeletal:        General: No swelling.  Cervical back: Neck supple.  Skin:    General: Skin is warm and dry.     Capillary Refill: Capillary refill takes less than 2 seconds.  Neurological:     Mental Status: She is alert.  Psychiatric:        Mood and Affect: Mood normal.     ED Results / Procedures / Treatments   Labs (all labs ordered are listed, but only abnormal results are displayed) Labs Reviewed  HCG, SERUM, QUALITATIVE - Abnormal; Notable for the following components:      Result Value   Preg, Serum POSITIVE (*)    All other components within normal limits  CBC - Abnormal; Notable for the following components:   Hemoglobin 10.2 (*)    HCT 33.4 (*)    MCV 72.3 (*)    MCH 22.1 (*)    RDW 16.2 (*)    All other components within normal  limits  HCG, QUANTITATIVE, PREGNANCY - Abnormal; Notable for the following components:   hCG, Beta Chain, Quant, S 33,718 (*)    All other components within normal limits    EKG None  Radiology US  OB LESS THAN 14 WEEKS WITH OB TRANSVAGINAL Result Date: 04/09/2024 CLINICAL DATA:  Vaginal bleeding. EXAM: OBSTETRIC <14 WK US  AND TRANSVAGINAL OB US  TECHNIQUE: Both transabdominal and transvaginal ultrasound examinations were performed for complete evaluation of the gestation as well as the maternal uterus, adnexal regions, and pelvic cul-de-sac. Transvaginal technique was performed to assess early pregnancy. COMPARISON:  None Available. FINDINGS: Intrauterine gestational sac: Single. Note is made that the gestational sac is within the mid to lower portion of the endometrial cavity. Yolk sac:  Visualized. Embryo:  Visualized. Cardiac Activity: Visualized. Heart Rate: 107 bpm CRL:  3.6 mm   6 w   0 d                  US  EDC: 12/03/2024 Subchorionic hemorrhage: Small subchronic hemorrhage is seen inferior to the gestational sac on transabdominal imaging. Maternal uterus/adnexae: Corpus luteum cyst in the left ovary measures 3.5 x 2.7 x 2.6 cm. Right ovary is within normal limits. There is trace free fluid in the pelvis. IMPRESSION: 1. Single live intrauterine gestation measuring 6 weeks 0 days by crown-rump length. Note is made that the gestational sac is slightly low lying in the endometrial cavity. 2. Small subchorionic hemorrhage. 3. Likely corpus luteum cyst in the left ovary. 4. Trace free fluid in the pelvis. Electronically Signed   By: Tyron Gallon M.D.   On: 04/09/2024 17:28    Procedures Procedures    Medications Ordered in ED Medications - No data to display  ED Course/ Medical Decision Making/ A&P                                 Medical Decision Making Amount and/or Complexity of Data Reviewed Labs: ordered. Radiology: ordered.   This patient presents to the ED for concern of  vaginal bleeding.  Differential diagnosis includes threatened miscarriage, inevitable abortion, subchorionic hemorrhage   Lab Tests:  I Ordered, and personally interpreted labs.  The pertinent results include: CBC shows stable hemoglobin at baseline, hCG positive,   Imaging Studies ordered:  I ordered imaging studies including ultrasound OB less than 14 weeks I independently visualized and interpreted imaging which showed pending at time of sign out. I agree with the radiologist interpretation   Problem List / ED Course:  Patient presents the emergency department today with concerns of vaginal bleeding.  She reports recently finding out that she was pregnant this last weekend.  She is estimating her last menstrual period was on 03/03/2024.  No history of any bleeding disorders.  States that she woke up this morning with some light vaginal spotting.  This spotting worsened to the recent increased amount of bleeding.  She endorses some abdominal discomfort in the lower abdomen as well as low back. This is reassuring.  No focal abdominal tenderness.  Low back unremarkable.  Chaperoned pelvic exam reveals a closed cervical os with some bleeding present.  No mucopurulent drainage present.  Basic labs to assess hemoglobin state as well as current pregnancy status ordered.  Quantitative hCG ordered for assessment of possible stage of pregnancy and for trending with outpatient OB/GYN.  3:25PM  Care of [place] transferred to NP [facility] and Dr. Martina Sledge at the end of my shift as the patient will require reassessment once labs/imaging have resulted. Patient presentation, ED course, and plan of care discussed with review of all pertinent labs and imaging. Please see his/her note for further details regarding further ED course and disposition. Plan at time of handoff is disposition per US  results. Possible threatened miscarriage and will require 48-hour repeat beta-hcg for assessment of trend. Possible subchorionic  hemorrhage. [facility] to [facility] for this pregnancy. This may be altered or completely changed at the discretion of the oncoming team pending results of further workup.  Final Clinical Impression(s) / ED Diagnoses Final diagnoses:  Subchorionic hemorrhage in first trimester    Rx / DC Orders ED Discharge Orders     None         Concetta Dee, PA-C 04/11/24 [id number]    Concetta Dee, PA-C 04/11/24 0908    Afton Horse T, DO 04/12/24 [phone]

## 2024-04-09 NOTE — Discharge Instructions (Addendum)
 Please refer to the attached instructions. Follow-up with Dr. Tomblin as discussed.
